# Patient Record
Sex: Female | Born: 1975 | Race: White | Hispanic: No | Marital: Married | Smoking: Current every day smoker
Health system: Southern US, Community
[De-identification: ages and names within clinical notes are randomized; demographics above are authoritative.]

## PROBLEM LIST (undated history)

## (undated) ENCOUNTER — Inpatient Hospital Stay (HOSPITAL_COMMUNITY): Payer: Self-pay

## (undated) DIAGNOSIS — Z98891 History of uterine scar from previous surgery: Secondary | ICD-10-CM

## (undated) DIAGNOSIS — D649 Anemia, unspecified: Secondary | ICD-10-CM

## (undated) DIAGNOSIS — E282 Polycystic ovarian syndrome: Secondary | ICD-10-CM

## (undated) DIAGNOSIS — K219 Gastro-esophageal reflux disease without esophagitis: Secondary | ICD-10-CM

## (undated) DIAGNOSIS — J45909 Unspecified asthma, uncomplicated: Secondary | ICD-10-CM

## (undated) DIAGNOSIS — K589 Irritable bowel syndrome without diarrhea: Secondary | ICD-10-CM

## (undated) DIAGNOSIS — F419 Anxiety disorder, unspecified: Secondary | ICD-10-CM

## (undated) DIAGNOSIS — F99 Mental disorder, not otherwise specified: Secondary | ICD-10-CM

## (undated) DIAGNOSIS — F32A Depression, unspecified: Secondary | ICD-10-CM

## (undated) DIAGNOSIS — F988 Other specified behavioral and emotional disorders with onset usually occurring in childhood and adolescence: Secondary | ICD-10-CM

## (undated) DIAGNOSIS — R87619 Unspecified abnormal cytological findings in specimens from cervix uteri: Secondary | ICD-10-CM

## (undated) DIAGNOSIS — E079 Disorder of thyroid, unspecified: Secondary | ICD-10-CM

## (undated) DIAGNOSIS — I10 Essential (primary) hypertension: Secondary | ICD-10-CM

## (undated) DIAGNOSIS — IMO0002 Reserved for concepts with insufficient information to code with codable children: Secondary | ICD-10-CM

## (undated) DIAGNOSIS — D219 Benign neoplasm of connective and other soft tissue, unspecified: Secondary | ICD-10-CM

## (undated) DIAGNOSIS — E785 Hyperlipidemia, unspecified: Secondary | ICD-10-CM

## (undated) DIAGNOSIS — O26879 Cervical shortening, unspecified trimester: Secondary | ICD-10-CM

## (undated) DIAGNOSIS — Z5189 Encounter for other specified aftercare: Secondary | ICD-10-CM

## (undated) DIAGNOSIS — M1990 Unspecified osteoarthritis, unspecified site: Secondary | ICD-10-CM

## (undated) DIAGNOSIS — O3430 Maternal care for cervical incompetence, unspecified trimester: Secondary | ICD-10-CM

## (undated) DIAGNOSIS — T7840XA Allergy, unspecified, initial encounter: Secondary | ICD-10-CM

## (undated) DIAGNOSIS — O24419 Gestational diabetes mellitus in pregnancy, unspecified control: Secondary | ICD-10-CM

## (undated) HISTORY — DX: Anemia, unspecified: D64.9

## (undated) HISTORY — PX: TONSILLECTOMY: SUR1361

## (undated) HISTORY — DX: Other specified behavioral and emotional disorders with onset usually occurring in childhood and adolescence: F98.8

## (undated) HISTORY — PX: LEEP: SHX91

## (undated) HISTORY — DX: Allergy, unspecified, initial encounter: T78.40XA

## (undated) HISTORY — DX: Gestational diabetes mellitus in pregnancy, unspecified control: O24.419

## (undated) HISTORY — DX: Polycystic ovarian syndrome: E28.2

## (undated) HISTORY — DX: Hyperlipidemia, unspecified: E78.5

## (undated) HISTORY — DX: History of uterine scar from previous surgery: Z98.891

## (undated) HISTORY — DX: Unspecified osteoarthritis, unspecified site: M19.90

## (undated) HISTORY — PX: WISDOM TOOTH EXTRACTION: SHX21

## (undated) HISTORY — PX: TUBAL LIGATION: SHX77

## (undated) HISTORY — DX: Disorder of thyroid, unspecified: E07.9

## (undated) HISTORY — DX: Encounter for other specified aftercare: Z51.89

## (undated) HISTORY — PX: THERAPEUTIC ABORTION: SHX798

## (undated) HISTORY — DX: Depression, unspecified: F32.A

## (undated) HISTORY — PX: BARTHOLIN CYST MARSUPIALIZATION: SHX5383

---

## 1998-01-17 ENCOUNTER — Other Ambulatory Visit: Admission: RE | Admit: 1998-01-17 | Discharge: 1998-01-17 | Payer: Self-pay | Admitting: Obstetrics and Gynecology

## 1998-04-12 ENCOUNTER — Ambulatory Visit (HOSPITAL_COMMUNITY): Admission: RE | Admit: 1998-04-12 | Discharge: 1998-04-12 | Payer: Self-pay | Admitting: Obstetrics and Gynecology

## 1998-04-19 ENCOUNTER — Inpatient Hospital Stay (HOSPITAL_COMMUNITY): Admission: AD | Admit: 1998-04-19 | Discharge: 1998-04-19 | Payer: Self-pay | Admitting: Obstetrics & Gynecology

## 1999-03-05 ENCOUNTER — Other Ambulatory Visit: Admission: RE | Admit: 1999-03-05 | Discharge: 1999-03-05 | Payer: Self-pay | Admitting: Obstetrics and Gynecology

## 2000-05-21 ENCOUNTER — Other Ambulatory Visit: Admission: RE | Admit: 2000-05-21 | Discharge: 2000-05-21 | Payer: Self-pay | Admitting: Obstetrics and Gynecology

## 2001-05-23 ENCOUNTER — Other Ambulatory Visit: Admission: RE | Admit: 2001-05-23 | Discharge: 2001-05-23 | Payer: Self-pay | Admitting: Obstetrics and Gynecology

## 2001-11-01 ENCOUNTER — Inpatient Hospital Stay (HOSPITAL_COMMUNITY): Admission: AD | Admit: 2001-11-01 | Discharge: 2001-11-04 | Payer: Self-pay | Admitting: Obstetrics and Gynecology

## 2002-01-17 ENCOUNTER — Other Ambulatory Visit: Admission: RE | Admit: 2002-01-17 | Discharge: 2002-01-17 | Payer: Self-pay | Admitting: Obstetrics and Gynecology

## 2003-01-17 ENCOUNTER — Other Ambulatory Visit: Admission: RE | Admit: 2003-01-17 | Discharge: 2003-01-17 | Payer: Self-pay | Admitting: Obstetrics and Gynecology

## 2004-06-30 ENCOUNTER — Emergency Department (HOSPITAL_COMMUNITY): Admission: EM | Admit: 2004-06-30 | Discharge: 2004-06-30 | Payer: Self-pay | Admitting: *Deleted

## 2005-03-02 ENCOUNTER — Emergency Department (HOSPITAL_COMMUNITY): Admission: EM | Admit: 2005-03-02 | Discharge: 2005-03-02 | Payer: Self-pay | Admitting: Emergency Medicine

## 2005-03-02 ENCOUNTER — Inpatient Hospital Stay (HOSPITAL_COMMUNITY): Admission: RE | Admit: 2005-03-02 | Discharge: 2005-03-07 | Payer: Self-pay | Admitting: Psychiatry

## 2005-03-03 ENCOUNTER — Ambulatory Visit: Payer: Self-pay | Admitting: Psychiatry

## 2010-11-28 ENCOUNTER — Other Ambulatory Visit: Payer: Self-pay | Admitting: Obstetrics and Gynecology

## 2010-11-29 ENCOUNTER — Other Ambulatory Visit: Payer: Self-pay | Admitting: Obstetrics and Gynecology

## 2010-12-10 ENCOUNTER — Other Ambulatory Visit (HOSPITAL_COMMUNITY): Payer: Self-pay | Admitting: Obstetrics and Gynecology

## 2010-12-10 DIAGNOSIS — O3680X0 Pregnancy with inconclusive fetal viability, not applicable or unspecified: Secondary | ICD-10-CM

## 2010-12-11 ENCOUNTER — Ambulatory Visit (HOSPITAL_COMMUNITY)
Admission: RE | Admit: 2010-12-11 | Discharge: 2010-12-11 | Disposition: A | Discharge status: Home / Self Care | Payer: BC Managed Care – PPO | Source: Ambulatory Visit | Attending: Obstetrics and Gynecology | Admitting: Obstetrics and Gynecology

## 2010-12-11 DIAGNOSIS — O209 Hemorrhage in early pregnancy, unspecified: Secondary | ICD-10-CM | POA: Insufficient documentation

## 2010-12-11 DIAGNOSIS — O3680X0 Pregnancy with inconclusive fetal viability, not applicable or unspecified: Secondary | ICD-10-CM

## 2010-12-11 DIAGNOSIS — Z3689 Encounter for other specified antenatal screening: Secondary | ICD-10-CM | POA: Insufficient documentation

## 2011-02-04 ENCOUNTER — Ambulatory Visit (HOSPITAL_COMMUNITY)
Admission: RE | Admit: 2011-02-04 | Discharge: 2011-02-04 | Disposition: A | Discharge status: Home / Self Care | Payer: BC Managed Care – PPO | Source: Ambulatory Visit | Attending: Internal Medicine | Admitting: Internal Medicine

## 2011-02-04 ENCOUNTER — Other Ambulatory Visit (HOSPITAL_COMMUNITY): Payer: Self-pay | Admitting: Internal Medicine

## 2011-02-04 DIAGNOSIS — I10 Essential (primary) hypertension: Secondary | ICD-10-CM | POA: Insufficient documentation

## 2011-02-04 DIAGNOSIS — R0989 Other specified symptoms and signs involving the circulatory and respiratory systems: Secondary | ICD-10-CM | POA: Insufficient documentation

## 2011-02-04 DIAGNOSIS — R059 Cough, unspecified: Secondary | ICD-10-CM

## 2011-02-04 DIAGNOSIS — Z0000 Encounter for general adult medical examination without abnormal findings: Secondary | ICD-10-CM

## 2011-02-04 DIAGNOSIS — R05 Cough: Secondary | ICD-10-CM | POA: Insufficient documentation

## 2011-02-04 DIAGNOSIS — F17200 Nicotine dependence, unspecified, uncomplicated: Secondary | ICD-10-CM | POA: Insufficient documentation

## 2012-09-29 DIAGNOSIS — S93409A Sprain of unspecified ligament of unspecified ankle, initial encounter: Secondary | ICD-10-CM | POA: Insufficient documentation

## 2013-02-14 LAB — OB RESULTS CONSOLE HIV ANTIBODY (ROUTINE TESTING): HIV: NONREACTIVE

## 2013-02-14 LAB — OB RESULTS CONSOLE RUBELLA ANTIBODY, IGM: Rubella: IMMUNE

## 2013-02-14 LAB — OB RESULTS CONSOLE ABO/RH: RH Type: POSITIVE

## 2013-02-14 LAB — OB RESULTS CONSOLE RPR: RPR: NONREACTIVE

## 2013-02-14 LAB — OB RESULTS CONSOLE HEPATITIS B SURFACE ANTIGEN: Hepatitis B Surface Ag: NEGATIVE

## 2013-02-14 LAB — OB RESULTS CONSOLE ANTIBODY SCREEN: Antibody Screen: NEGATIVE

## 2013-04-18 ENCOUNTER — Other Ambulatory Visit: Payer: Self-pay

## 2013-04-19 ENCOUNTER — Ambulatory Visit (HOSPITAL_COMMUNITY)
Admission: RE | Admit: 2013-04-19 | Discharge: 2013-04-19 | Disposition: A | Discharge status: Home / Self Care | Payer: PRIVATE HEALTH INSURANCE | Source: Ambulatory Visit | Attending: Obstetrics and Gynecology | Admitting: Obstetrics and Gynecology

## 2013-04-19 ENCOUNTER — Encounter (HOSPITAL_COMMUNITY): Payer: Self-pay

## 2013-04-19 ENCOUNTER — Other Ambulatory Visit (HOSPITAL_COMMUNITY): Payer: Self-pay | Admitting: Obstetrics and Gynecology

## 2013-04-19 DIAGNOSIS — O3430 Maternal care for cervical incompetence, unspecified trimester: Secondary | ICD-10-CM

## 2013-04-19 DIAGNOSIS — O3490 Maternal care for abnormality of pelvic organ, unspecified, unspecified trimester: Secondary | ICD-10-CM | POA: Insufficient documentation

## 2013-04-19 DIAGNOSIS — O3440 Maternal care for other abnormalities of cervix, unspecified trimester: Secondary | ICD-10-CM | POA: Insufficient documentation

## 2013-04-19 DIAGNOSIS — O3410 Maternal care for benign tumor of corpus uteri, unspecified trimester: Secondary | ICD-10-CM | POA: Insufficient documentation

## 2013-04-19 DIAGNOSIS — O09529 Supervision of elderly multigravida, unspecified trimester: Secondary | ICD-10-CM | POA: Insufficient documentation

## 2013-04-19 DIAGNOSIS — Z1389 Encounter for screening for other disorder: Secondary | ICD-10-CM | POA: Insufficient documentation

## 2013-04-19 DIAGNOSIS — O34219 Maternal care for unspecified type scar from previous cesarean delivery: Secondary | ICD-10-CM | POA: Insufficient documentation

## 2013-04-19 DIAGNOSIS — Z363 Encounter for antenatal screening for malformations: Secondary | ICD-10-CM | POA: Insufficient documentation

## 2013-04-19 DIAGNOSIS — O358XX0 Maternal care for other (suspected) fetal abnormality and damage, not applicable or unspecified: Secondary | ICD-10-CM | POA: Insufficient documentation

## 2013-04-19 DIAGNOSIS — O26879 Cervical shortening, unspecified trimester: Secondary | ICD-10-CM | POA: Insufficient documentation

## 2013-04-20 ENCOUNTER — Encounter (HOSPITAL_COMMUNITY): Payer: Self-pay | Admitting: Obstetrics and Gynecology

## 2013-04-20 DIAGNOSIS — O26879 Cervical shortening, unspecified trimester: Secondary | ICD-10-CM

## 2013-04-20 HISTORY — DX: Cervical shortening, unspecified trimester: O26.879

## 2013-04-20 NOTE — H&P (Signed)
Kathleen Weaver is a 37 y.o. female [id number] at [facility] with shortened cervix, confirmed by MFM scan as indication for cerclage.  Pt with h/o TAB x 2, h/o term LTCS x 2, and LEEP.  D/w pt at length options for management with myself and MFM physician. Pregnancy uncomplicated to this point.  Pt voices understanding to r/b/a, wishes to proceed.  Maternal Medical History:  Fetal activity: Perceived fetal activity is normal.    Prenatal complications: no prenatal complications Prenatal Complications - Diabetes: none.    OB History   Grav Para Term Preterm Abortions TAB SAB Ect Mult Living   6 2 2  3 2 1   2     G1 & 2 TAB G3 LTCS 38wk female, breech, myomectomy with delivery G4 LTCS 38wk, female, repeat - increased scar tissue G5 SAB G6 present  +abn pap - ASCUS can't r/o HGSIL, nl colpo - LEEP Nl pap 2013 No STDs  Past Medical History  Diagnosis Date  . Short cervix affecting pregnancy 04/20/2013  borderline HTN, question thyroid dz, IBS, HA, ADD, Anxiety, Bipolar, PPD, asthmatic bronchitis, environmental allergies  PSH: T&A, D&E x 2, LTCS x 2, LEEP, marsuoilization of Bartholin's gland  Family History: cervical CA, HTN, Afib, depression, Lupus, CAD, DM; Down's in 3rd cousin. Social History:  has no tobacco, alcohol, and drug history on file. h/o tob - quit before pregnancy, MJ prior to pregnancy.  Remarried, opthalmic tech at [employer]   Prenatal Transfer Tool  Maternal Diabetes: No Genetic Screening: Normal Maternal Ultrasounds/Referrals: Abnormal:  Findings:   Other:short cervix Fetal Ultrasounds or other Referrals:  Referred to Materal Fetal Medicine  Maternal Substance Abuse:  Yes:  Type: Marijuana before preg, occ Significant Maternal Medications:  Meds include: Other: PNV, Prilosec Significant Maternal Lab Results:  None Other Comments:  None  Review of Systems  Constitutional: Negative.   HENT: Negative.   Eyes: Negative.   Respiratory: Negative.   Cardiovascular:  Negative.   Gastrointestinal: Negative.   Genitourinary:       Pelvic pressure, cramping  Musculoskeletal: Negative.   Skin: Negative.   Neurological: Negative.   Psychiatric/Behavioral: Negative.       Last menstrual period 12/03/2012. Maternal Exam:  Abdomen: Surgical scars: low transverse.   Fundal height is appropriate for gestation.    Introitus: Normal vulva. Normal vagina.  Pelvis: questionable for delivery.      Physical Exam  Constitutional: She is oriented to person, place, and time. She appears well-developed and well-nourished.  HENT:  Head: Normocephalic and atraumatic.  Cardiovascular: Normal rate and regular rhythm.   Respiratory: Effort normal and breath sounds normal. No respiratory distress. She has no wheezes.  GI: Soft. Bowel sounds are normal. She exhibits no distension. There is no tenderness.  Musculoskeletal: Normal range of motion.  Neurological: She is alert and oriented to person, place, and time.  Skin: Skin is warm and dry.  Psychiatric: She has a normal mood and affect. Her behavior is normal.    Prenatal labs: ABO, Rh:  A+ Antibody:  negative Rubella:  immune RPR:   NR HBsAg:   neg HIV:   neg GBS:   unknown  Hgb 12.0/ Ur Cx neg/GC neg/ Chl neg/ Plt 310K/ First Tri Scr WNL/ AFP WNL/ TSH 3.470  [place] nl NT, 3x2 cm LUS fibroid EDC 09/08/12 shortened CL, ant fibroid, ant plac, limited anat MFM [place] vtx, nl AFI, fundal plac, funneling cervix, ant fibroid, female - nl limited anat - needs f/u  Assessment/Plan:  37yo [id number] at 20+ with short CL for McDonald cerclage Motrin 800 mg D/w pt r/b/a - wishes to proceed Will f/u CL and anat   Kathleen Weaver,Kathleen Weaver 04/20/2013, 8:59 PM

## 2013-04-21 ENCOUNTER — Encounter (HOSPITAL_COMMUNITY): Payer: Self-pay | Admitting: *Deleted

## 2013-04-21 ENCOUNTER — Ambulatory Visit (HOSPITAL_COMMUNITY): Payer: PRIVATE HEALTH INSURANCE | Admitting: Anesthesiology

## 2013-04-21 ENCOUNTER — Ambulatory Visit (HOSPITAL_COMMUNITY)
Admission: RE | Admit: 2013-04-21 | Discharge: 2013-04-21 | Disposition: A | Discharge status: Home / Self Care | Payer: PRIVATE HEALTH INSURANCE | Source: Ambulatory Visit | Attending: Obstetrics and Gynecology | Admitting: Obstetrics and Gynecology

## 2013-04-21 ENCOUNTER — Encounter (HOSPITAL_COMMUNITY): Payer: PRIVATE HEALTH INSURANCE | Admitting: Anesthesiology

## 2013-04-21 ENCOUNTER — Encounter (HOSPITAL_COMMUNITY)
Admission: RE | Disposition: A | Discharge status: Home / Self Care | Payer: Self-pay | Source: Ambulatory Visit | Attending: Obstetrics and Gynecology

## 2013-04-21 DIAGNOSIS — O26879 Cervical shortening, unspecified trimester: Secondary | ICD-10-CM | POA: Insufficient documentation

## 2013-04-21 DIAGNOSIS — O3432 Maternal care for cervical incompetence, second trimester: Secondary | ICD-10-CM

## 2013-04-21 DIAGNOSIS — O3430 Maternal care for cervical incompetence, unspecified trimester: Secondary | ICD-10-CM

## 2013-04-21 HISTORY — DX: Cervical shortening, unspecified trimester: O26.879

## 2013-04-21 HISTORY — DX: Unspecified asthma, uncomplicated: J45.909

## 2013-04-21 HISTORY — DX: Maternal care for cervical incompetence, unspecified trimester: O34.30

## 2013-04-21 HISTORY — PX: CERVICAL CERCLAGE: SHX1329

## 2013-04-21 HISTORY — DX: Mental disorder, not otherwise specified: F99

## 2013-04-21 LAB — CBC
Hemoglobin: 10.7 g/dL — ABNORMAL LOW (ref 12.0–15.0)
MCHC: 34.2 g/dL (ref 30.0–36.0)
RDW: 13.7 % (ref 11.5–15.5)
WBC: 9 10*3/uL (ref 4.0–10.5)

## 2013-04-21 SURGERY — CERCLAGE, CERVIX, VAGINAL APPROACH
Anesthesia: Spinal | Site: Vagina

## 2013-04-21 MED ORDER — ONDANSETRON HCL 4 MG/2ML IJ SOLN
INTRAMUSCULAR | Status: DC | PRN
  Administered 2013-04-21: 4 mg via INTRAVENOUS

## 2013-04-21 MED ORDER — KETOROLAC TROMETHAMINE 30 MG/ML IJ SOLN: 15.0000 mg | Freq: Once | INTRAMUSCULAR | Status: DC | PRN

## 2013-04-21 MED ORDER — LACTATED RINGERS IV SOLN
INTRAVENOUS | Status: DC
  Administered 2013-04-21: 1000 mL via INTRAVENOUS
  Administered 2013-04-21: 07:00:00 via INTRAVENOUS

## 2013-04-21 MED ORDER — PHENYLEPHRINE HCL 10 MG/ML IJ SOLN
INTRAMUSCULAR | Status: DC | PRN
  Administered 2013-04-21: 40 ug via INTRAVENOUS
  Administered 2013-04-21: 80 ug via INTRAVENOUS
  Administered 2013-04-21: 40 ug via INTRAVENOUS

## 2013-04-21 MED ORDER — FENTANYL CITRATE 0.05 MG/ML IJ SOLN
INTRAMUSCULAR | Status: AC
  Filled 2013-04-21: qty 2

## 2013-04-21 MED ORDER — FENTANYL CITRATE 0.05 MG/ML IJ SOLN: 25.0000 ug | INTRAMUSCULAR | Status: DC | PRN

## 2013-04-21 MED ORDER — HYDROCODONE-ACETAMINOPHEN 5-325 MG PO TABS: 1.0000 | ORAL_TABLET | Freq: Four times a day (QID) | ORAL | Status: DC | PRN

## 2013-04-21 MED ORDER — METOCLOPRAMIDE HCL 5 MG/ML IJ SOLN: 10.0000 mg | Freq: Once | INTRAMUSCULAR | Status: DC | PRN

## 2013-04-21 MED ORDER — PANTOPRAZOLE SODIUM 40 MG PO TBEC
40.0000 mg | DELAYED_RELEASE_TABLET | Freq: Once | ORAL | Status: AC
  Administered 2013-04-21: 40 mg via ORAL

## 2013-04-21 MED ORDER — FENTANYL CITRATE 0.05 MG/ML IJ SOLN
INTRAMUSCULAR | Status: DC | PRN
  Administered 2013-04-21 (×2): 50 ug via INTRAVENOUS

## 2013-04-21 MED ORDER — LACTATED RINGERS IV SOLN: INTRAVENOUS | Status: DC

## 2013-04-21 MED ORDER — MEPERIDINE HCL 25 MG/ML IJ SOLN: 6.2500 mg | INTRAMUSCULAR | Status: DC | PRN

## 2013-04-21 MED ORDER — BUPIVACAINE IN DEXTROSE 0.75-8.25 % IT SOLN
INTRATHECAL | Status: DC | PRN
  Administered 2013-04-21: 1.2 mL via INTRATHECAL

## 2013-04-21 MED ORDER — PANTOPRAZOLE SODIUM 40 MG PO TBEC
DELAYED_RELEASE_TABLET | ORAL | Status: AC
  Filled 2013-04-21: qty 1

## 2013-04-21 SURGICAL SUPPLY — 21 items
CANISTER SUCT 3000ML (MISCELLANEOUS) ×2 IMPLANT
CATH ROBINSON RED A/P 16FR (CATHETERS) IMPLANT
CLOTH BEACON ORANGE TIMEOUT ST (SAFETY) ×2 IMPLANT
COUNTER NEEDLE 1200 MAGNETIC (NEEDLE) IMPLANT
GLOVE BIO SURGEON STRL SZ 6.5 (GLOVE) ×2 IMPLANT
GLOVE BIO SURGEON STRL SZ7 (GLOVE) ×2 IMPLANT
GOWN STRL REIN XL XLG (GOWN DISPOSABLE) ×4 IMPLANT
NDL SPNL 22GX3.5 QUINCKE BK (NEEDLE) IMPLANT
NEEDLE MAYO .5 CIRCLE (NEEDLE) ×2 IMPLANT
NEEDLE SPNL 22GX3.5 QUINCKE BK (NEEDLE) IMPLANT
NS IRRIG 1000ML POUR BTL (IV SOLUTION) ×2 IMPLANT
PACK VAGINAL MINOR WOMEN LF (CUSTOM PROCEDURE TRAY) ×2 IMPLANT
PAD OB MATERNITY 4.3X12.25 (PERSONAL CARE ITEMS) ×2 IMPLANT
PAD PREP 24X48 CUFFED NSTRL (MISCELLANEOUS) ×2 IMPLANT
SUT PROLENE 1 CTX 30  8455H (SUTURE) ×2
SUT PROLENE 1 CTX 30 8455H (SUTURE) ×2 IMPLANT
SYR CONTROL 10ML LL (SYRINGE) IMPLANT
TOWEL OR 17X24 6PK STRL BLUE (TOWEL DISPOSABLE) ×4 IMPLANT
TUBING NON-CON 1/4 X 20 CONN (TUBING) ×2 IMPLANT
WATER STERILE IRR 1000ML POUR (IV SOLUTION) ×2 IMPLANT
YANKAUER SUCT BULB TIP NO VENT (SUCTIONS) ×2 IMPLANT

## 2013-04-21 NOTE — Interval H&P Note (Signed)
History and Physical Interval Note:  04/21/2013 7:00 AM  Kathleen Weaver  has presented today for surgery, with the diagnosis of Cervical shortening  The various methods of treatment have been discussed with the patient and family. After consideration of risks, benefits and other options for treatment, the patient has consented to  Procedure(s): CERCLAGE CERVICAL (N/A) as a surgical intervention .  The patient's history has been reviewed, patient examined, no change in status, stable for surgery.  I have reviewed the patient's chart and labs.  Questions were answered to the patient's satisfaction.     BOVARD,Raden Byington

## 2013-04-21 NOTE — Transfer of Care (Signed)
Immediate Anesthesia Transfer of Care Note  Patient: Kathleen Weaver  Procedure(s) Performed: Procedure(s): CERCLAGE CERVICAL (N/A)  Patient Location: PACU  Anesthesia Type:Spinal  Level of Consciousness: awake, alert  and oriented  Airway & Oxygen Therapy: Patient Spontanous Breathing  Post-op Assessment: Report given to PACU RN and Post -op Vital signs reviewed and stable  Post vital signs: Reviewed and stable  Complications: No apparent anesthesia complications

## 2013-04-21 NOTE — Preoperative (Signed)
Beta Blockers   Reason not to administer Beta Blockers:Not Applicable 

## 2013-04-21 NOTE — Anesthesia Procedure Notes (Signed)
Spinal  Patient location during procedure: OR Start time: 04/21/2013 7:24 AM End time: 04/21/2013 7:31 AM Staffing Anesthesiologist: Leilani Able Performed by: anesthesiologist  Preanesthetic Checklist Completed: patient identified, surgical consent, pre-op evaluation, timeout performed, IV checked, risks and benefits discussed and monitors and equipment checked Spinal Block Patient position: sitting Prep: DuraPrep Patient monitoring: heart rate, cardiac monitor, continuous pulse ox and blood pressure Approach: midline Location: L3-4 Injection technique: single-shot Needle Needle type: Sprotte  Needle gauge: 24 G Needle length: 9 cm Assessment Sensory level: T10 Additional Notes Needed 18G Tuohy X 1 to 7cm followed by Sprotte to 11cm .

## 2013-04-21 NOTE — Anesthesia Preprocedure Evaluation (Signed)
Anesthesia Evaluation  Patient identified by MRN, date of birth, ID band Patient awake    Reviewed: Allergy & Precautions, H&P , NPO status , Patient's Chart, lab work & pertinent test results, reviewed documented beta blocker date and time   History of Anesthesia Complications Negative for: history of anesthetic complications  Airway Mallampati: II TM Distance: >3 FB Neck ROM: full    Dental  (+) Teeth Intact   Pulmonary asthma (allergy-related) , former smoker,  breath sounds clear to auscultation        Cardiovascular negative cardio ROS  Rhythm:regular Rate:Normal     Neuro/Psych negative neurological ROS  negative psych ROS   GI/Hepatic Neg liver ROS, GERD-  Medicated,  Endo/Other  Morbid obesity  Renal/GU negative Renal ROS  negative genitourinary   Musculoskeletal   Abdominal   Peds  Hematology negative hematology ROS (+)   Anesthesia Other Findings   Reproductive/Obstetrics (+) Pregnancy (20 weeks, cervical shortening)                           Anesthesia Physical Anesthesia Plan  ASA: III  Anesthesia Plan: Spinal   Post-op Pain Management:    Induction:   Airway Management Planned:   Additional Equipment:   Intra-op Plan:   Post-operative Plan:   Informed Consent: I have reviewed the patients History and Physical, chart, labs and discussed the procedure including the risks, benefits and alternatives for the proposed anesthesia with the patient or authorized representative who has indicated his/her understanding and acceptance.     Plan Discussed with: Surgeon and CRNA  Anesthesia Plan Comments:         Anesthesia Quick Evaluation

## 2013-04-21 NOTE — Anesthesia Postprocedure Evaluation (Signed)
Anesthesia Post Note  Patient: Kathleen Weaver  Procedure(s) Performed: Procedure(s) (LRB): CERCLAGE CERVICAL (N/A)  Anesthesia type: Spinal  Patient location: PACU  Post pain: Pain level controlled  Post assessment: Post-op Vital signs reviewed  Last Vitals:  Filed Vitals:   04/21/13 0830  BP: 115/43  Pulse: 79  Temp:   Resp: 17    Post vital signs: Reviewed  Level of consciousness: awake  Complications: No apparent anesthesia complications

## 2013-04-21 NOTE — Brief Op Note (Signed)
04/21/2013  8:20 AM  PATIENT:  Kathleen Weaver  37 y.o. female  PRE-OPERATIVE DIAGNOSIS:  Cervical shortening  POST-OPERATIVE DIAGNOSIS:  cervical shortening  PROCEDURE:  Procedure(s): CERCLAGE CERVICAL (N/A)  McDonald knot at 12 o'clock  SURGEON:  Surgeon(s) and Role:    * Sherron Monday, MD - Primary  ANESTHESIA:   spinal  EBL:  Total I/O In: 900 [I.V.:900] Out: 160 [Urine:150; Blood:10]  BLOOD ADMINISTERED:none  DRAINS: none   LOCAL MEDICATIONS USED:  NONE  SPECIMEN:  No Specimen  DISPOSITION OF SPECIMEN:  N/A  COUNTS:  YES  TOURNIQUET:  * No tourniquets in log *  DICTATION: .Other Dictation: Dictation Number [id number]  PLAN OF CARE: Discharge to home after PACU  PATIENT DISPOSITION:  PACU - hemodynamically stable.   Delay start of Pharmacological VTE agent (>24hrs) due to surgical blood loss or risk of bleeding: not applicable

## 2013-04-22 NOTE — Op Note (Signed)
NAMEJANIYLAH, Kathleen Weaver NO.:  [id number]  MEDICAL RECORD NO.:  [id number]  LOCATION:  [facility]                          FACILITY:  [facility]  PHYSICIAN:  Sherron Monday, MD        DATE OF BIRTH:  March 31, 1976  DATE OF PROCEDURE:  04/21/2013 DATE OF DISCHARGE:  04/21/2013                              OPERATIVE REPORT   PREOPERATIVE DIAGNOSIS:  Cervical shortening.  POSTOPERATIVE DIAGNOSIS:  Cervical shortening.  PROCEDURE:  Cervical cerclage, McDonald cerclage with the knot at 12 o'clock.  SURGEON:  Sherron Monday, MD  ANESTHESIA:  Spinal.  ESTIMATED BLOOD LOSS:  10 mL.  URINE OUTPUT:  150 mL clear urine via I and O cath.  IV FLUIDS:  900 mL.  COMPLICATIONS:  None.  PATHOLOGY:  None.  PROCEDURE:  After informed consent was reviewed with the patient, risks, benefits and alternatives were discussed with her.  The cerclage was indicated as her cervix had noted to be shortened on ultrasound.  She was followed by MFM who noted funneling as well as shortening and recommended cerclage as a possible treatment.  She was taken to the OR, where spinal anesthesia was placed and found to be adequate.  She was then placed in supine position with a leftward tilt, placed in the Yellofin stirrups, prepped with Betadine externally and saline internally.  A single suture of Prolene was placed as a McDonald cerclage at the cervicovaginal junction as a pursestring without difficulty or complication. Knot was tied at 12 and the sutures were left long, so they will be able to be found at the time of removal.  The patient tolerated the procedure well.  Sponge, lap, and needle count was correct x2 per the operating staff.  FINDINGS:  Cervix noted to be approximately 1-2 cm in length.  At prior to surgery, after surgery cerclage was palpated area and the cervix was felt to be approximately 2 cm in length.     Sherron Monday, MD     JB/MEDQ  D:  04/21/2013  T:  04/22/2013  Job:  [id number]

## 2013-04-24 ENCOUNTER — Encounter (HOSPITAL_COMMUNITY): Payer: Self-pay | Admitting: Obstetrics and Gynecology

## 2013-04-28 ENCOUNTER — Other Ambulatory Visit (HOSPITAL_COMMUNITY): Payer: Self-pay | Admitting: Obstetrics and Gynecology

## 2013-04-28 DIAGNOSIS — O09529 Supervision of elderly multigravida, unspecified trimester: Secondary | ICD-10-CM

## 2013-05-01 ENCOUNTER — Encounter (HOSPITAL_COMMUNITY): Payer: Self-pay | Admitting: *Deleted

## 2013-05-01 ENCOUNTER — Inpatient Hospital Stay (HOSPITAL_COMMUNITY)
Admission: AD | Admit: 2013-05-01 | Discharge: 2013-05-01 | Disposition: A | Discharge status: Home / Self Care | Payer: PRIVATE HEALTH INSURANCE | Source: Ambulatory Visit | Attending: Obstetrics and Gynecology | Admitting: Obstetrics and Gynecology

## 2013-05-01 DIAGNOSIS — R109 Unspecified abdominal pain: Secondary | ICD-10-CM

## 2013-05-01 DIAGNOSIS — M545 Low back pain, unspecified: Secondary | ICD-10-CM | POA: Insufficient documentation

## 2013-05-01 DIAGNOSIS — O26899 Other specified pregnancy related conditions, unspecified trimester: Secondary | ICD-10-CM

## 2013-05-01 DIAGNOSIS — Z87891 Personal history of nicotine dependence: Secondary | ICD-10-CM | POA: Insufficient documentation

## 2013-05-01 DIAGNOSIS — O3430 Maternal care for cervical incompetence, unspecified trimester: Secondary | ICD-10-CM | POA: Insufficient documentation

## 2013-05-01 DIAGNOSIS — N949 Unspecified condition associated with female genital organs and menstrual cycle: Secondary | ICD-10-CM | POA: Insufficient documentation

## 2013-05-01 DIAGNOSIS — O26879 Cervical shortening, unspecified trimester: Secondary | ICD-10-CM | POA: Insufficient documentation

## 2013-05-01 DIAGNOSIS — O3432 Maternal care for cervical incompetence, second trimester: Secondary | ICD-10-CM

## 2013-05-01 HISTORY — DX: Essential (primary) hypertension: I10

## 2013-05-01 HISTORY — DX: Anxiety disorder, unspecified: F41.9

## 2013-05-01 HISTORY — DX: Benign neoplasm of connective and other soft tissue, unspecified: D21.9

## 2013-05-01 HISTORY — DX: Unspecified abnormal cytological findings in specimens from cervix uteri: R87.619

## 2013-05-01 HISTORY — DX: Reserved for concepts with insufficient information to code with codable children: IMO0002

## 2013-05-01 LAB — URINE MICROSCOPIC-ADD ON

## 2013-05-01 LAB — URINALYSIS, ROUTINE W REFLEX MICROSCOPIC
Glucose, UA: NEGATIVE mg/dL
Leukocytes, UA: NEGATIVE
Nitrite: NEGATIVE
Protein, ur: NEGATIVE mg/dL
Specific Gravity, Urine: >1.03 — ABNORMAL HIGH (ref 1.005–1.030)
Urobilinogen, UA: 0.2 mg/dL (ref 0.0–1.0)

## 2013-05-01 NOTE — MAU Provider Note (Signed)
History     CSN: [id number]  Arrival date and time: 05/01/13 1422   First Provider Initiated Contact with Patient 05/01/13 1600      No chief complaint on file.  HPI Kathleen Weaver is 37 y.o. [phone] [redacted]w[redacted]d weeks presenting with lower abdominal pressure that began yesterday--intermittent yesterday, none through the night, and then restarted at work--tightening in the lower abdomin, radiated to the lower back and felt bad-lightheaded with nausea.  Ate lunch without difficulty.  Had cerclage 04/21/13 -Dr. Irena Cords follow up 1 week later.  Cerclage for hx of 2 cervical bx and LEEP procedure.  Denies vaginal bleeding.  Not sexually active at this time.    Past Medical History  Diagnosis Date  . Short cervix affecting pregnancy 04/20/2013  . Asthma   . Cervical cerclage suture present 04/21/2013  . Hypertension     no meds with BP  . Fibroid   . Abnormal Pap smear     bx x2; LEEP  . Mental disorder     mild bipolar  . Anxiety     no meds    Past Surgical History  Procedure Laterality Date  . Cervical cerclage N/A 04/21/2013    Procedure: CERCLAGE CERVICAL;  Surgeon: Sherron Monday, MD;  Location: [facility];  Service: Gynecology;  Laterality: N/A;  . Cesarean section    . Tonsillectomy    . Bartholin cyst marsupialization    . Therapeutic abortion      Family History  Problem Relation Age of Onset  . Cancer Mother     2 cousins- w/breast  . Hypertension Mother   . Cancer Paternal Uncle   . Hypertension Maternal Grandmother   . Stroke Maternal Grandmother   . Diabetes Maternal Grandfather   . Heart disease Maternal Grandfather   . Hypertension Maternal Grandfather   . Diabetes Paternal Grandmother   . Heart disease Paternal Grandfather   . Hypertension Paternal Grandfather   . Hearing loss Neg Hx     History  Substance Use Topics  . Smoking status: Former Smoker -- 0.50 packs/day    Quit date: 11/19/2012  . Smokeless tobacco: Former Neurosurgeon     Comment: quit with  preg  . Alcohol Use: No    Allergies:  Allergies  Allergen Reactions  . Latex     rash    Prescriptions prior to admission  Medication Sig Dispense Refill  . acetaminophen (TYLENOL) 500 MG tablet Take 500 mg by mouth every 6 (six) hours as needed for mild pain, moderate pain or headache.      Marland Kitchen omeprazole (PRILOSEC) 20 MG capsule Take 20 mg by mouth every other day.      . Prenatal Vit-Fe Fumarate-FA (PRENATAL MULTIVITAMIN) TABS tablet Take 1 tablet by mouth at bedtime.         Review of Systems  Constitutional: Positive for malaise/fatigue.  Gastrointestinal: Positive for abdominal pain (lower pelvic pressure). Negative for nausea and vomiting.  Genitourinary: Negative for dysuria, urgency, frequency and hematuria.       Neg for vaginal bleeding or discharge   Physical Exam   Blood pressure 133/68, pulse 88, temperature 98.6 F (37 C), temperature source Oral, resp. rate 20, height 5' 7.5" (1.715 m), weight 294 lb (133.358 kg), last menstrual period 12/03/2012, SpO2 99.00%.  Physical Exam  Constitutional: She is oriented to person, place, and time. She appears well-developed and well-nourished. No distress.  HENT:  Head: Normocephalic.  Neck: Normal range of motion.  Cardiovascular: Normal rate.  Respiratory: Effort normal.  GI: Soft. She exhibits no distension and no mass. There is no tenderness. There is no rebound and no guarding.  Genitourinary: There is no rash, tenderness or lesion on the right labia. There is no rash, tenderness or lesion on the left labia. Uterus is enlarged. Uterus is not tender. Cervix exhibits no motion tenderness, no discharge and no friability. There is tenderness around the vagina. No erythema or bleeding around the vagina. Vaginal discharge (normal appearing white discharge) found.  The cerclage appears to be intact.  On gentle exam, the cervix appears to be snug.  Neg for bleeding  Neurological: She is alert and oriented to person, place, and  time.  Skin: Skin is warm and dry.  Psychiatric: She has a normal mood and affect. Her behavior is normal.   Results for orders placed during the hospital encounter of 05/01/13 (from the past 24 hour(s))  URINALYSIS, ROUTINE W REFLEX MICROSCOPIC     Status: Abnormal   Collection Time    05/01/13  2:40 PM      Result Value Range   Color, Urine YELLOW  YELLOW   APPearance CLEAR  CLEAR   Specific Gravity, Urine >1.030 (*) 1.005 - 1.030   pH 5.5  5.0 - 8.0   Glucose, UA NEGATIVE  NEGATIVE mg/dL   Hgb urine dipstick TRACE (*) NEGATIVE   Bilirubin Urine NEGATIVE  NEGATIVE   Ketones, ur NEGATIVE  NEGATIVE mg/dL   Protein, ur NEGATIVE  NEGATIVE mg/dL   Urobilinogen, UA 0.2  0.0 - 1.0 mg/dL   Nitrite NEGATIVE  NEGATIVE   Leukocytes, UA NEGATIVE  NEGATIVE  URINE MICROSCOPIC-ADD ON     Status: Abnormal   Collection Time    05/01/13  2:40 PM      Result Value Range   Squamous Epithelial / LPF RARE  RARE   WBC, UA 0-2  <3 WBC/hpf   RBC / HPF 3-6  <3 RBC/hpf   Bacteria, UA FEW (*) RARE   Crystals CA OXALATE CRYSTALS (*) NEGATIVE   Urine-Other MUCOUS PRESENT     MAU Course  Procedures   TOCO- rare irritability                        FHR by doppler 140s  MDM Reported MSE to Dr. Jackelyn Knife.  Order given for speculum/pelvic exam.    Assessment and Plan  A:  Lower pelvic pressure at [redacted]w[redacted]d gestation     Cerclage placed 04/21/13.  P:  Patient has appointment tomorrow in MFM      Has appointment with Dr. Ellyn Hack next week     Return for increased sxs, vaginal bleeding or leaking of fluid    Stressed importance of staying well hydrated.  KEY,EVE M 05/01/2013, 4:03 PM

## 2013-05-01 NOTE — MAU Note (Signed)
Been having a lot of tightening, stays for a while. Started yesterday morning. Has sharp pains in back,not always with abd pain. increased pelvic pressure.  Had a cerclage put  In 12/12.  No bleeding.

## 2013-05-02 ENCOUNTER — Ambulatory Visit (HOSPITAL_COMMUNITY)
Admission: RE | Admit: 2013-05-02 | Discharge: 2013-05-02 | Disposition: A | Discharge status: Home / Self Care | Payer: PRIVATE HEALTH INSURANCE | Source: Ambulatory Visit | Attending: Obstetrics and Gynecology | Admitting: Obstetrics and Gynecology

## 2013-05-02 VITALS — BP 133/68 | HR 86 | Wt 294.0 lb

## 2013-05-02 DIAGNOSIS — O358XX0 Maternal care for other (suspected) fetal abnormality and damage, not applicable or unspecified: Secondary | ICD-10-CM | POA: Insufficient documentation

## 2013-05-02 DIAGNOSIS — O26879 Cervical shortening, unspecified trimester: Secondary | ICD-10-CM | POA: Insufficient documentation

## 2013-05-02 DIAGNOSIS — O3410 Maternal care for benign tumor of corpus uteri, unspecified trimester: Secondary | ICD-10-CM | POA: Insufficient documentation

## 2013-05-02 DIAGNOSIS — O26872 Cervical shortening, second trimester: Secondary | ICD-10-CM

## 2013-05-02 DIAGNOSIS — O3432 Maternal care for cervical incompetence, second trimester: Secondary | ICD-10-CM

## 2013-05-02 DIAGNOSIS — O09529 Supervision of elderly multigravida, unspecified trimester: Secondary | ICD-10-CM | POA: Insufficient documentation

## 2013-05-02 DIAGNOSIS — Z1389 Encounter for screening for other disorder: Secondary | ICD-10-CM | POA: Insufficient documentation

## 2013-05-02 DIAGNOSIS — O34219 Maternal care for unspecified type scar from previous cesarean delivery: Secondary | ICD-10-CM | POA: Insufficient documentation

## 2013-05-02 DIAGNOSIS — O3440 Maternal care for other abnormalities of cervix, unspecified trimester: Secondary | ICD-10-CM | POA: Insufficient documentation

## 2013-05-02 DIAGNOSIS — Z363 Encounter for antenatal screening for malformations: Secondary | ICD-10-CM | POA: Insufficient documentation

## 2013-05-02 NOTE — Progress Notes (Signed)
Kathleen Weaver  was seen today for an ultrasound appointment.  See full report in [facility].  Impression: Single IUP at 21 4/7 weeks Suspected cervical insufficiency s/p cerclage Cervical length stable (1.6 cm) - cerclage appears intact Somewhat limited views of the fetal heart again obtained due to poor ultrasound penetration and fetal position Posterior uterine myoma as noted above Normal amnioitc fluid volume  Recommendations: Evaluate cerclage with digital exam, speculum exam or transvaginal scan about every 1-2 weeks after surgery.  Follow up ultrasound in 4 weeks to reevaluate the fetal heart.  Alpha Gula, MD

## 2013-05-31 ENCOUNTER — Ambulatory Visit (HOSPITAL_COMMUNITY): Payer: PRIVATE HEALTH INSURANCE

## 2013-06-01 ENCOUNTER — Other Ambulatory Visit (HOSPITAL_COMMUNITY): Payer: Self-pay | Admitting: Obstetrics and Gynecology

## 2013-06-01 ENCOUNTER — Ambulatory Visit (HOSPITAL_COMMUNITY)
Admission: RE | Admit: 2013-06-01 | Discharge: 2013-06-01 | Disposition: A | Discharge status: Home / Self Care | Payer: PRIVATE HEALTH INSURANCE | Source: Ambulatory Visit | Attending: Obstetrics and Gynecology | Admitting: Obstetrics and Gynecology

## 2013-06-01 ENCOUNTER — Other Ambulatory Visit (HOSPITAL_COMMUNITY): Payer: Self-pay | Admitting: Maternal and Fetal Medicine

## 2013-06-01 DIAGNOSIS — O26872 Cervical shortening, second trimester: Secondary | ICD-10-CM

## 2013-06-01 DIAGNOSIS — O26879 Cervical shortening, unspecified trimester: Secondary | ICD-10-CM | POA: Insufficient documentation

## 2013-06-01 DIAGNOSIS — O3440 Maternal care for other abnormalities of cervix, unspecified trimester: Secondary | ICD-10-CM | POA: Insufficient documentation

## 2013-06-01 DIAGNOSIS — O09529 Supervision of elderly multigravida, unspecified trimester: Secondary | ICD-10-CM

## 2013-06-01 DIAGNOSIS — O3410 Maternal care for benign tumor of corpus uteri, unspecified trimester: Secondary | ICD-10-CM | POA: Insufficient documentation

## 2013-06-01 DIAGNOSIS — O34219 Maternal care for unspecified type scar from previous cesarean delivery: Secondary | ICD-10-CM | POA: Insufficient documentation

## 2013-06-01 IMAGING — US US OB FOLLOW-UP
1 series · 12 of 28 positions shown · non-contrast
Comparison: none

[Series 1: us ob follow-up · 0.13mm/px · 12 of 47 slices shown]
[im 2/47]
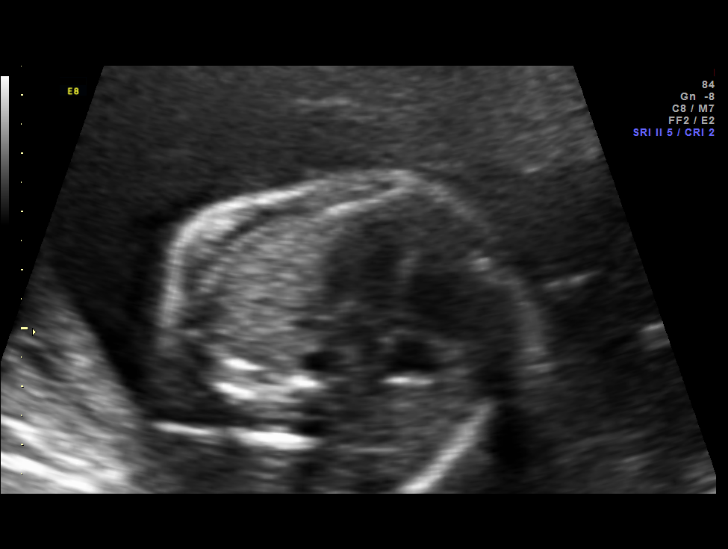
[im 6/47]
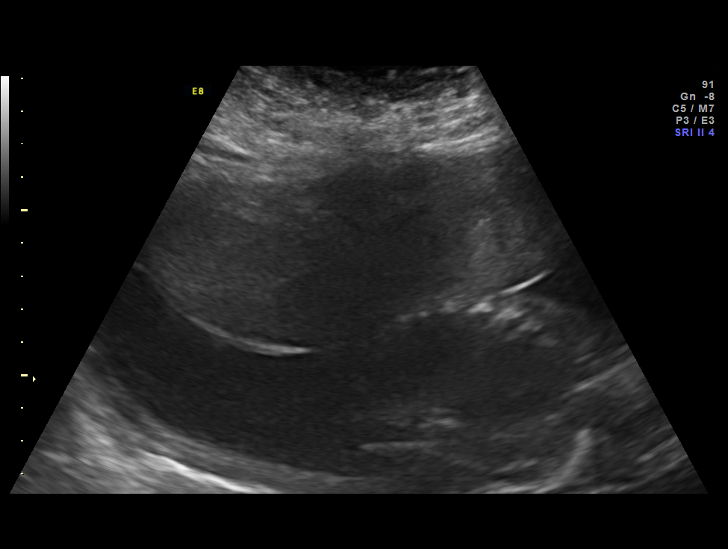
[im 9/47]
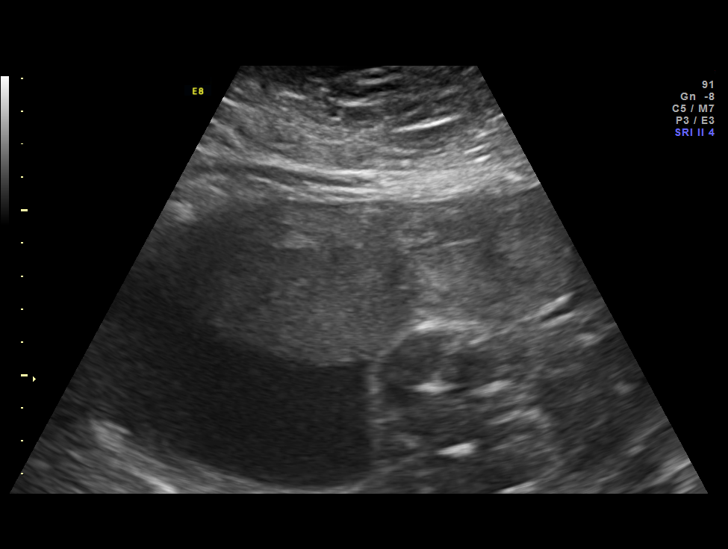
[im 14/47]
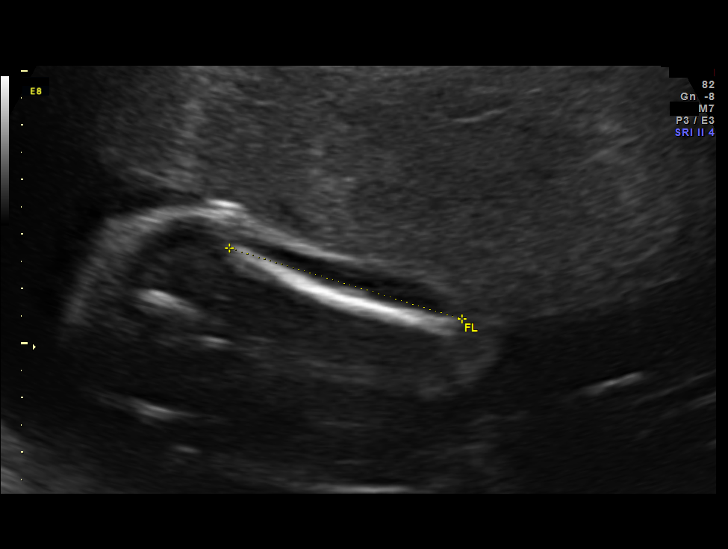
[im 18/47]
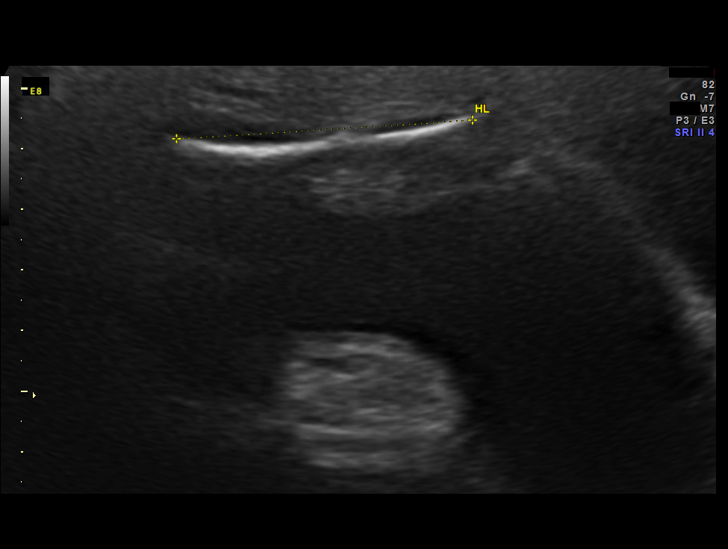
[im 21/47]
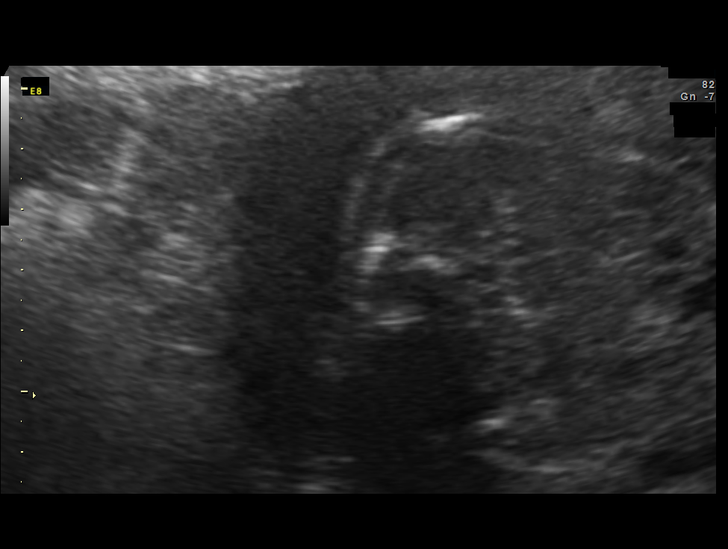
[im 26/47]
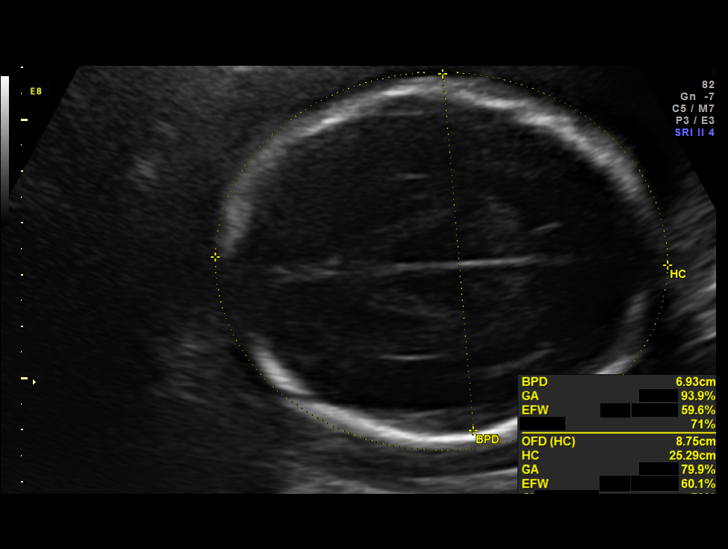
[im 29/47]
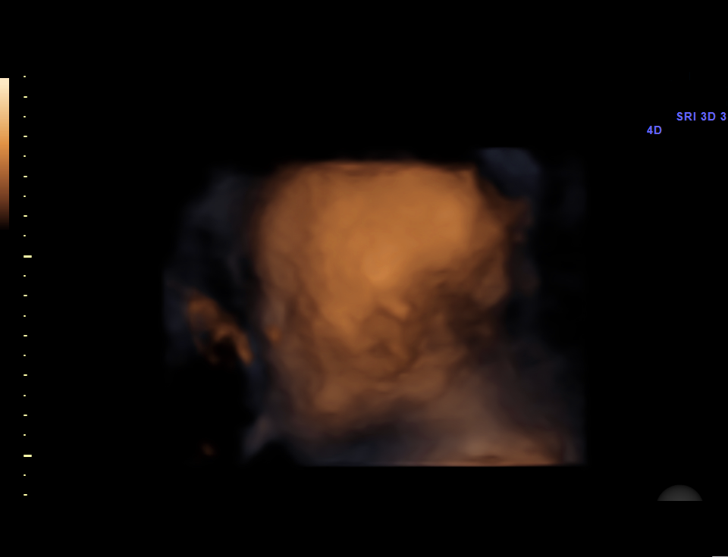
[im 33/47]
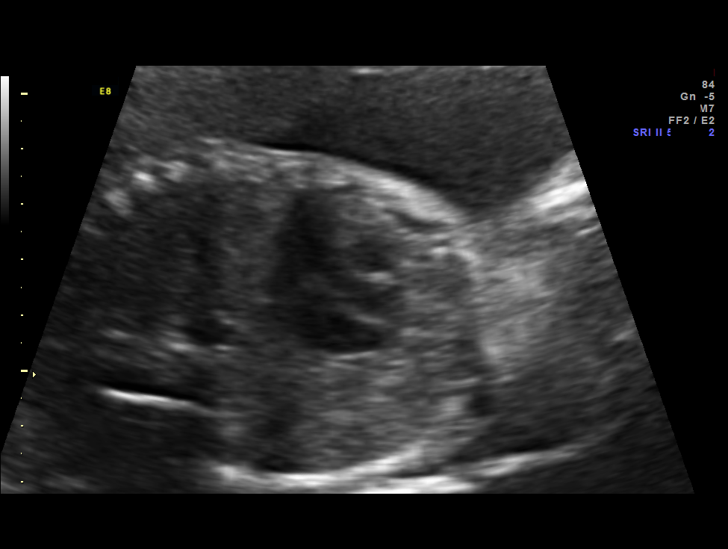
[im 38/47]
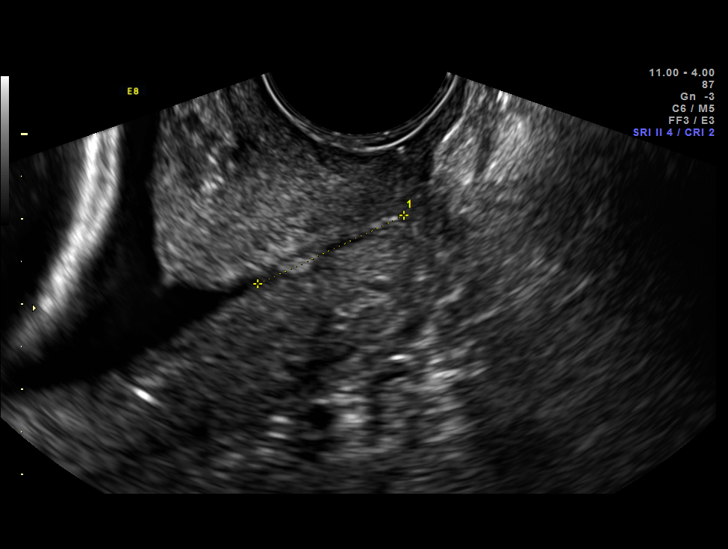
[im 41/47]
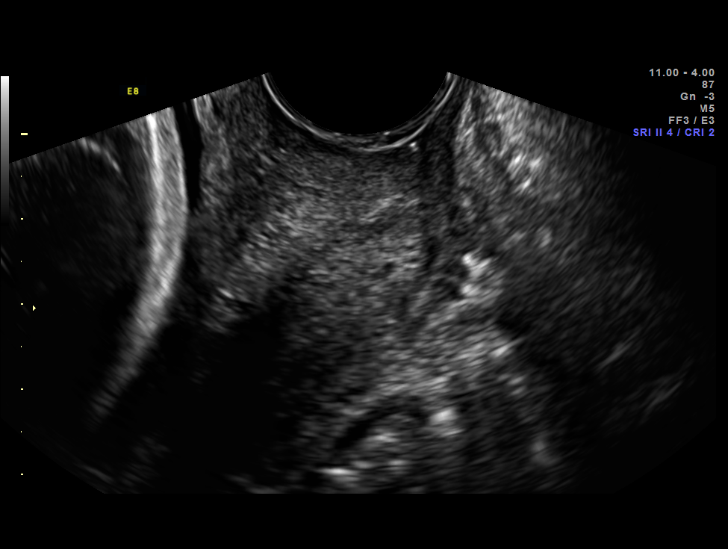
[im 45/47]
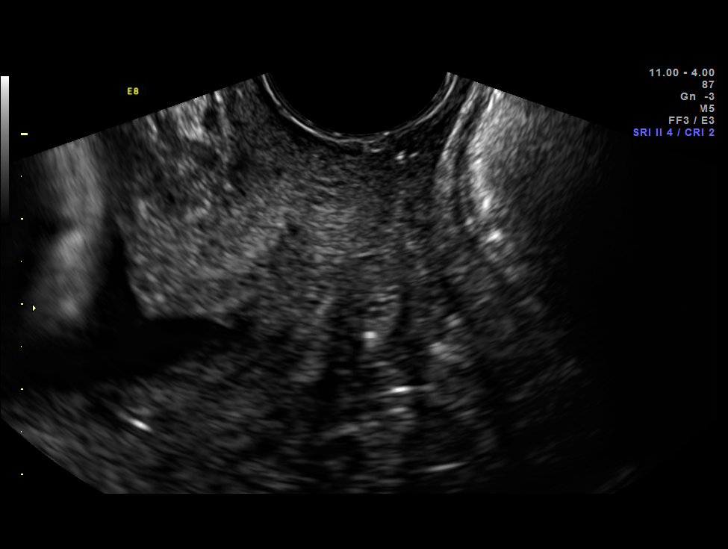

[12 of 28 positions shown; findings below may reference images not displayed]

OBSTETRICS REPORT
                      (Signed Final [DATE] [DATE])

Service(s) Provided

 US OB FOLLOW UP                                       76816.1
 US MFM OB TRANSVAGINAL                                76817.2
Indications

 Cervical shortening; S/P cerclage
 Advanced maternal age (AMA), Multigravida (low
 risk first trimester screen)
 History of cesarean delivery x2, currently pregnant   654.20, [JX]
 Previous cervical surgery (LEEP)
 Uterine scar, other than C/S (myomectomy)
 Uterine fibroid
Fetal Evaluation

 Num Of Fetuses:    1
 Fetal Heart Rate:  150                          bpm
 Cardiac Activity:  Observed
 Presentation:      Cephalic
 Placenta:          Fundal, above cervical os
 P. Cord            Previously Visualized
 Insertion:

 Amniotic Fluid
 AFI FV:      Subjectively within normal limits
 AFI Sum:     19.43   cm       78  %Tile     Larg Pckt:    5.16  cm
 RUQ:   4.77    cm   RLQ:    4.91   cm    LUQ:   5.16    cm   LLQ:    4.59   cm
Biometry

 BPD:     68.1  mm     G. Age:  27w 3d                CI:         78.3   70 - 86
 OFD:       87  mm                                    FL/HC:      19.2   18.6 -

 HC:     250.3  mm     G. Age:  27w 1d       71  %    HC/AC:      1.14   1.04 -

 AC:       219  mm     G. Age:  26w 3d       57  %    FL/BPD:     70.5   71 - 87
 FL:        48  mm     G. Age:  26w 1d       43  %    FL/AC:      21.9   20 - 24
 HUM:     48.9  mm     G. Age:  28w 6d     > 95  %

 Est. FW:     931  gm      2 lb 1 oz     64  %
Gestational Age

 LMP:           25w 6d        Date:  [DATE]                 EDD:   [DATE]
 U/S Today:     26w 5d                                        EDD:   [DATE]
 Best:          25w 6d     Det. By:  LMP  ([DATE])          EDD:   [DATE]
Anatomy

 Cranium:          Previously seen        Aortic Arch:      Not well visualized
 Fetal Cavum:      Previously seen        Ductal Arch:      Not well visualized
 Ventricles:       Previously seen        Diaphragm:        Appears normal
 Choroid Plexus:   Previously seen        Stomach:          Appears normal, left
                                                            sided
 Cerebellum:       Previously seen        Abdomen:          Previously seen
 Posterior Fossa:  Previously seen        Abdominal Wall:   Previously seen
 Nuchal Fold:      Not applicable (>20    Cord Vessels:     Previously seen
                   wks GA)
 Face:             Orbits and profile     Kidneys:          Appear normal
                   previously seen
 Lips:             Previously seen        Bladder:          Appears normal
 Heart:            Appears normal         Spine:            Previously seen
                   (4CH, axis, and
                   situs)
 RVOT:             Previously seen        Lower             Previously seen
                                          Extremities:
 LVOT:             Not well visualized    Upper             Previously seen
                                          Extremities:

 Other:  Female gender.
Cervix Uterus Adnexa

 Cervical Length:    1.9      cm

 Cervix:       Measured transvaginally; cerclage seen
Impression

 IUP at 25+6 weeks
 Normal interval anatomy; anatomic survey complete except
 for RVOT and arches
 Normal amniotic fluid volume
 Appropriate interval growth with EFW at the 64th %tile
Recommendations

 Continue to follow cervix clinically
 Follow-up as clinically indicated

## 2013-06-05 ENCOUNTER — Encounter: Payer: Self-pay | Admitting: Physician Assistant

## 2013-06-05 ENCOUNTER — Ambulatory Visit (INDEPENDENT_AMBULATORY_CARE_PROVIDER_SITE_OTHER): Payer: PRIVATE HEALTH INSURANCE | Admitting: Physician Assistant

## 2013-06-05 VITALS — BP 136/68 | HR 88 | Temp 98.1°F | Resp 16 | Ht 68.0 in | Wt 297.0 lb

## 2013-06-05 DIAGNOSIS — J209 Acute bronchitis, unspecified: Secondary | ICD-10-CM

## 2013-06-05 MED ORDER — PROMETHAZINE-DM 6.25-15 MG/5ML PO SYRP
5.0000 mL | ORAL_SOLUTION | Freq: Four times a day (QID) | ORAL | Status: DC | PRN
Start: 2013-06-05 — End: 2013-07-09

## 2013-06-05 MED ORDER — CEPHALEXIN 500 MG PO CAPS: 500.0000 mg | ORAL_CAPSULE | Freq: Three times a day (TID) | ORAL | Status: DC

## 2013-06-05 NOTE — Progress Notes (Signed)
   Subjective:    Patient ID: Kathleen Weaver, female    DOB: December 14, 1975, 38 y.o.   MRN: [id number]  Cough This is a new problem. Episode onset: 2-3 but cold for 2 weeks. The problem has been gradually worsening. The problem occurs constantly. The cough is productive of blood-tinged sputum. Associated symptoms include a fever, postnasal drip, rhinorrhea, shortness of breath and wheezing. Pertinent negatives include no chest pain, chills, ear congestion, ear pain, headaches, heartburn, hemoptysis, myalgias, nasal congestion, rash, sore throat, sweats or weight loss. Treatments tried: mucinex. The treatment provided no relief. Her past medical history is significant for asthma.   Current Outpatient Prescriptions on File Prior to Visit  Medication Sig Dispense Refill  . acetaminophen (TYLENOL) 500 MG tablet Take 500 mg by mouth every 6 (six) hours as needed for mild pain, moderate pain or headache.      Marland Kitchen omeprazole (PRILOSEC) 20 MG capsule Take 20 mg by mouth every other day.      . Prenatal Vit-Fe Fumarate-FA (PRENATAL MULTIVITAMIN) TABS tablet Take 1 tablet by mouth at bedtime.        No current facility-administered medications on file prior to visit.   Past Medical History  Diagnosis Date  . Short cervix affecting pregnancy 04/20/2013  . Asthma   . Cervical cerclage suture present 04/21/2013  . Hypertension     no meds with BP  . Fibroid   . Abnormal Pap smear     bx x2; LEEP  . Mental disorder     mild bipolar  . Anxiety     no meds    Review of Systems  Constitutional: Positive for fever. Negative for chills and weight loss.  HENT: Positive for postnasal drip and rhinorrhea. Negative for ear pain and sore throat.   Respiratory: Positive for cough, shortness of breath and wheezing. Negative for hemoptysis.   Cardiovascular: Negative for chest pain.  Gastrointestinal: Negative for heartburn.  Musculoskeletal: Negative for myalgias.  Skin: Negative for rash.  Neurological:  Negative for headaches.       Objective:   Physical Exam  Constitutional: She is oriented to person, place, and time. She appears well-developed and well-nourished.  HENT:  Head: Normocephalic and atraumatic.  Right Ear: External ear normal.  Left Ear: External ear normal.  Nose: Nose normal.  Mouth/Throat: Oropharynx is clear and moist.  Eyes: Conjunctivae are normal. Pupils are equal, round, and reactive to light.  Neck: Normal range of motion. Neck supple.  Cardiovascular: Normal rate and regular rhythm.   Pulmonary/Chest: Effort normal. No respiratory distress. She has wheezes. She has no rales. She exhibits no tenderness.  Abdominal: Soft. Bowel sounds are normal.  Lymphadenopathy:    She has no cervical adenopathy.  Neurological: She is alert and oriented to person, place, and time.  Skin: Skin is warm and dry.      Assessment & Plan:  Acute bronchitis - Plan: cephALEXin (KEFLEX) 500 MG capsule, promethazine-dextromethorphan (PROMETHAZINE-DM) 6.25-15 MG/5ML syrup + pregnant Patient will go to the ER if she has any wording SOB, CP.

## 2013-06-05 NOTE — Patient Instructions (Signed)

## 2013-06-21 ENCOUNTER — Inpatient Hospital Stay (HOSPITAL_COMMUNITY)
Admission: AD | Admit: 2013-06-21 | Discharge: 2013-06-21 | Disposition: A | Discharge status: Home / Self Care | Payer: PRIVATE HEALTH INSURANCE | Source: Ambulatory Visit | Attending: Obstetrics and Gynecology | Admitting: Obstetrics and Gynecology

## 2013-06-21 ENCOUNTER — Encounter (HOSPITAL_COMMUNITY): Payer: Self-pay | Admitting: *Deleted

## 2013-06-21 DIAGNOSIS — R0602 Shortness of breath: Secondary | ICD-10-CM

## 2013-06-21 DIAGNOSIS — J069 Acute upper respiratory infection, unspecified: Secondary | ICD-10-CM | POA: Insufficient documentation

## 2013-06-21 DIAGNOSIS — O9989 Other specified diseases and conditions complicating pregnancy, childbirth and the puerperium: Secondary | ICD-10-CM

## 2013-06-21 DIAGNOSIS — O99891 Other specified diseases and conditions complicating pregnancy: Secondary | ICD-10-CM | POA: Insufficient documentation

## 2013-06-21 DIAGNOSIS — R030 Elevated blood-pressure reading, without diagnosis of hypertension: Secondary | ICD-10-CM | POA: Insufficient documentation

## 2013-06-21 DIAGNOSIS — O26879 Cervical shortening, unspecified trimester: Secondary | ICD-10-CM | POA: Insufficient documentation

## 2013-06-21 DIAGNOSIS — G4733 Obstructive sleep apnea (adult) (pediatric): Secondary | ICD-10-CM | POA: Insufficient documentation

## 2013-06-21 LAB — URINALYSIS, ROUTINE W REFLEX MICROSCOPIC
Bilirubin Urine: NEGATIVE
Glucose, UA: NEGATIVE mg/dL
KETONES UR: NEGATIVE mg/dL
LEUKOCYTES UA: NEGATIVE
NITRITE: NEGATIVE
PH: 6 (ref 5.0–8.0)
PROTEIN: NEGATIVE mg/dL
Specific Gravity, Urine: >1.03 — ABNORMAL HIGH (ref 1.005–1.030)
UROBILINOGEN UA: 0.2 mg/dL (ref 0.0–1.0)

## 2013-06-21 LAB — URINE MICROSCOPIC-ADD ON

## 2013-06-21 MED ORDER — CETIRIZINE HCL 10 MG PO CAPS: 10.0000 mg | ORAL_CAPSULE | Freq: Every day | ORAL | Status: DC

## 2013-06-21 NOTE — MAU Provider Note (Signed)
None     Chief Complaint:  URI and Shortness of Breath   Kathleen Weaver is  38 y.o. [id number] at [redacted]w[redacted]d presents complaining of URI and Shortness of Breath .  She states none contractions are associated with no vaginal bleeding, intact membranes, along with active fetal movement.  Pt reports several weeks of SOB with exertion or prolonged talking. Pt reports recently being treated with keflex for an URI but stopped after 4 days without improvement and started to have vaginal itching that has since resolved. Pt states that symptoms are worse in warm environments when she starts to have PND associated with coughing. Pt states that she has a hx of allergic bronchitis that often requires levaquin, inhalers (albuterol, advair) and zyrtec. Pt has used albuterol and has noticed some improvement. Pt denies unilateral swelling but does have bilateral edema that is worse at end of work day.  Pt has elevated BP that is consistent with clinic BPs. Not severe range, asymptomatic. Not on medication but has been prior to pregnancy.  Pt is not having reflux symptoms, chest pain, N/v, f/c. No HA, vision changes, RUQ pain. Pt does report snoring, dry mouth, increased fatigue and microsleeps during the day.  +FM no lof, no vb, no ctx  Obstetrical/Gynecological History: OB History   Grav Para Term Preterm Abortions TAB SAB Ect Mult Living   6 2 2  3 2 1   2      Past Medical History: Past Medical History  Diagnosis Date  . Short cervix affecting pregnancy 04/20/2013  . Asthma   . Cervical cerclage suture present 04/21/2013  . Hypertension     no meds with BP  . Fibroid   . Abnormal Pap smear     bx x2; LEEP  . Mental disorder     mild bipolar  . Anxiety     no meds    Past Surgical History: Past Surgical History  Procedure Laterality Date  . Cervical cerclage N/A 04/21/2013    Procedure: CERCLAGE CERVICAL;  Surgeon: Thornell Sartorius, MD;  Location: [facility];  Service: Gynecology;  Laterality: N/A;   . Cesarean section    . Tonsillectomy    . Bartholin cyst marsupialization    . Therapeutic abortion      Family History: Family History  Problem Relation Age of Onset  . Cancer Mother     2 cousins- w/breast  . Hypertension Mother   . Cancer Paternal Uncle   . Hypertension Maternal Grandmother   . Stroke Maternal Grandmother   . Diabetes Maternal Grandfather   . Heart disease Maternal Grandfather   . Hypertension Maternal Grandfather   . Diabetes Paternal Grandmother   . Heart disease Paternal Grandfather   . Hypertension Paternal Grandfather   . Hearing loss Neg Hx     Social History: History  Substance Use Topics  . Smoking status: Former Smoker -- 0.50 packs/day    Quit date: 11/19/2012  . Smokeless tobacco: Former Systems developer     Comment: quit with preg  . Alcohol Use: No    Allergies:  Allergies  Allergen Reactions  . Latex     rash    Meds:  Prescriptions prior to admission  Medication Sig Dispense Refill  . acetaminophen (TYLENOL) 500 MG tablet Take 500 mg by mouth every 6 (six) hours as needed for mild pain, moderate pain or headache.      . cephALEXin (KEFLEX) 500 MG capsule Take 1 capsule (500 mg total) by mouth 3 (three)  times daily.  30 capsule  0  . omeprazole (PRILOSEC) 20 MG capsule Take 20 mg by mouth every other day.      . Prenatal Vit-Fe Fumarate-FA (PRENATAL MULTIVITAMIN) TABS tablet Take 1 tablet by mouth at bedtime.       . promethazine-dextromethorphan (PROMETHAZINE-DM) 6.25-15 MG/5ML syrup Take 5 mLs by mouth 4 (four) times daily as needed for cough.  240 mL  0    Review of Systems -   Review of Systems  As above   Physical Exam  Blood pressure 147/83, pulse 86, temperature 98.2 F (36.8 C), temperature source Oral, resp. rate 20, height 5\' 8"  (1.727 m), weight 137.621 kg (303 lb 6.4 oz), last menstrual period 12/03/2012, SpO2 99.00%. Filed Vitals:   06/21/13 1559 06/21/13 1619  BP: 150/66 147/83  Pulse: 84 86  Temp: 98.2 F (36.8  C)   TempSrc: Oral   Resp: 18 20  Height: 5\' 8"  (1.727 m)   Weight: 137.621 kg (303 lb 6.4 oz)   SpO2: 99%      GENERAL: Well-developed, well-nourished female in no acute distress.  LUNGS: Clear to auscultation bilaterally. No wheezes throughout lung fields HEART: Regular rate and rhythm. ABDOMEN: Soft, nontender, nondistended, gravid.  EXTREMITIES: Nontender, 2+ edema bil to mid shin, 2+ distal pulses. Rcalf 42cm, L calf 43cm  FHT:  Baseline rate 130s bpm   Variability moderate  Accelerations present   Decelerations none Contractions: none  Labs: No results found for this or any previous visit (from the past 24 hour(s)). Imaging Studies:  Not reviewed for this pregnancy  Assessment: Kathleen Weaver is  38 y.o. [id number] at [redacted]w[redacted]d presents with likely pregnancy related shortness of breath with possible contributing OSA and allergic symptoms. Strongly doubt PE with normal HR, O2sat, and L=R calf (<2cm).  - recommend restarting zyrtec (Rx given) - may continue albuterol PRN - Primary OB may consider evaluation for OSA and asthma  Lancelot Alyea RYAN 2/11/20154:29 PM

## 2013-06-21 NOTE — MAU Note (Signed)
Patient state she has had an upper respiratory infection for about 2 weeks. And been on antibiotics and well as OTC medication. States she does not feel like she is not getting better. Now beginning to have shortness of breath, worse with exertion and talking. Denies pregnancy problems. Reports good fetal movement.

## 2013-06-21 NOTE — Discharge Instructions (Signed)

## 2013-06-30 ENCOUNTER — Encounter: Payer: Self-pay | Admitting: *Deleted

## 2013-06-30 ENCOUNTER — Encounter: Payer: PRIVATE HEALTH INSURANCE | Attending: Obstetrics and Gynecology | Admitting: *Deleted

## 2013-06-30 VITALS — Ht 68.0 in | Wt 299.8 lb

## 2013-06-30 DIAGNOSIS — O99810 Abnormal glucose complicating pregnancy: Secondary | ICD-10-CM | POA: Insufficient documentation

## 2013-06-30 NOTE — Progress Notes (Signed)
  Patient was seen on 06/30/13 for Gestational Diabetes self-management class at the [facility]. The following learning objectives were met by the patient during this course:   States the definition of Gestational Diabetes  States why dietary management is important in controlling blood glucose  Describes the effects of carbohydrates on blood glucose levels  Demonstrates ability to create a balanced meal plan  Demonstrates carbohydrate counting   States when to check blood glucose levels  Demonstrates proper blood glucose monitoring techniques  States the effect of stress and exercise on blood glucose levels  States the importance of limiting caffeine and abstaining from alcohol and smoking  Plan:  Aim for 2 Carb Choices per meal (30 grams) +/- 1 either way for breakfast Aim for 3 Carb Choices per meal (45 grams) +/- 1 either way from lunch and dinner Aim for 1-2 Carbs per snack Begin reading food labels for Total Carbohydrate and sugar grams of foods Consider  increasing your activity level by walking daily as tolerated Begin checking BG before breakfast and 1-2 hours after first bit of breakfast, lunch and dinner after  as directed by MD  Take medication  as directed by MD  Blood glucose monitor given: Accu Chek Nano BG Monitoring Kit Lot # [id number] 06-10-14 Exp: 06-10-14 Blood glucose reading: 106  Patient instructed to monitor glucose levels: FBS: 60 - <90 1 hour: <140 2 hour: <120  Patient received the following handouts:  Nutrition Diabetes and Pregnancy  Carbohydrate Counting List  Meal Planning worksheet  Patient will be seen for follow-up as needed.

## 2013-07-09 ENCOUNTER — Inpatient Hospital Stay (HOSPITAL_COMMUNITY)
Admission: AD | Admit: 2013-07-09 | Discharge: 2013-07-09 | DRG: 781 | Disposition: A | Discharge status: Home / Self Care | Payer: PRIVATE HEALTH INSURANCE | Source: Ambulatory Visit | Attending: Obstetrics and Gynecology | Admitting: Obstetrics and Gynecology

## 2013-07-09 ENCOUNTER — Encounter (HOSPITAL_COMMUNITY): Payer: Self-pay | Admitting: Family

## 2013-07-09 DIAGNOSIS — Y92009 Unspecified place in unspecified non-institutional (private) residence as the place of occurrence of the external cause: Secondary | ICD-10-CM

## 2013-07-09 DIAGNOSIS — O24419 Gestational diabetes mellitus in pregnancy, unspecified control: Secondary | ICD-10-CM

## 2013-07-09 DIAGNOSIS — Z87891 Personal history of nicotine dependence: Secondary | ICD-10-CM

## 2013-07-09 DIAGNOSIS — O26879 Cervical shortening, unspecified trimester: Secondary | ICD-10-CM | POA: Diagnosis present

## 2013-07-09 DIAGNOSIS — O99891 Other specified diseases and conditions complicating pregnancy: Secondary | ICD-10-CM

## 2013-07-09 DIAGNOSIS — O99810 Abnormal glucose complicating pregnancy: Secondary | ICD-10-CM | POA: Diagnosis present

## 2013-07-09 DIAGNOSIS — O9989 Other specified diseases and conditions complicating pregnancy, childbirth and the puerperium: Principal | ICD-10-CM

## 2013-07-09 DIAGNOSIS — W1809XA Striking against other object with subsequent fall, initial encounter: Secondary | ICD-10-CM | POA: Diagnosis present

## 2013-07-09 DIAGNOSIS — R42 Dizziness and giddiness: Secondary | ICD-10-CM

## 2013-07-09 LAB — URINE MICROSCOPIC-ADD ON

## 2013-07-09 LAB — CBC
HCT: 29.8 % — ABNORMAL LOW (ref 36.0–46.0)
HEMOGLOBIN: 9.9 g/dL — AB (ref 12.0–15.0)
MCH: 30.1 pg (ref 26.0–34.0)
MCHC: 33.2 g/dL (ref 30.0–36.0)
MCV: 90.6 fL (ref 78.0–100.0)
Platelets: 267 10*3/uL (ref 150–400)
RBC: 3.29 MIL/uL — AB (ref 3.87–5.11)
RDW: 14.6 % (ref 11.5–15.5)
WBC: 7.2 10*3/uL (ref 4.0–10.5)

## 2013-07-09 LAB — URINALYSIS, ROUTINE W REFLEX MICROSCOPIC
Bilirubin Urine: NEGATIVE
GLUCOSE, UA: NEGATIVE mg/dL
Ketones, ur: 15 mg/dL — AB
Leukocytes, UA: NEGATIVE
Nitrite: NEGATIVE
PH: 6 (ref 5.0–8.0)
Protein, ur: NEGATIVE mg/dL
SPECIFIC GRAVITY, URINE: 1.025 (ref 1.005–1.030)
Urobilinogen, UA: 0.2 mg/dL (ref 0.0–1.0)

## 2013-07-09 LAB — COMPREHENSIVE METABOLIC PANEL
ALK PHOS: 82 U/L (ref 39–117)
ALT: 9 U/L (ref 0–35)
AST: 11 U/L (ref 0–37)
Albumin: 2.3 g/dL — ABNORMAL LOW (ref 3.5–5.2)
BUN: 6 mg/dL (ref 6–23)
CHLORIDE: 103 meq/L (ref 96–112)
CO2: 22 mEq/L (ref 19–32)
Calcium: 9 mg/dL (ref 8.4–10.5)
Creatinine, Ser: 0.61 mg/dL (ref 0.50–1.10)
GFR calc non Af Amer: >90 mL/min (ref >90)
GLUCOSE: 104 mg/dL — AB (ref 70–99)
POTASSIUM: 4.5 meq/L (ref 3.7–5.3)
Sodium: 136 mEq/L — ABNORMAL LOW (ref 137–147)
Total Bilirubin: 0.2 mg/dL — ABNORMAL LOW (ref 0.3–1.2)
Total Protein: 6.2 g/dL (ref 6.0–8.3)

## 2013-07-09 NOTE — Discharge Instructions (Signed)

## 2013-07-09 NOTE — MAU Provider Note (Signed)
Chief Complaint:  Dizziness   None     HPI: Kathleen Weaver is a 38 y.o. [id number] at 108w1dwho presents to maternity admissions reporting dizziness x2-3 episodes when standing starting at 3 am on 07/08/13.  At 3 am yesterday, she walked back from the bathroom to her bedroom and caught herself beginning to fall as she hit her head slightly on the edge of the doorway.  She did not fall to the ground.  She was diagnosed with GDM 1 week ago and started strict carbohydrate counting.  She reports the changes to her diet have been drastic from her diet before the diagnosis.  She also started Glyburide yesterday morning, a few hours after the first episode of dizziness. She reports she is eating sugar-free snack at 7 pm every evening, and no food after this until breakfast.  She reports good fetal movement, denies abdominal pain/contractions, LOF, vaginal bleeding, vaginal itching/burning, urinary symptoms, h/a, n/v, or fever/chills.     Past Medical History: Past Medical History  Diagnosis Date  . Short cervix affecting pregnancy 04/20/2013  . Asthma   . Cervical cerclage suture present 04/21/2013  . Hypertension     no meds with BP  . Fibroid   . Abnormal Pap smear     bx x2; LEEP  . Mental disorder     mild bipolar  . Anxiety     no meds  . Gestational diabetes mellitus, antepartum     Past obstetric history: OB History  Gravida Para Term Preterm AB SAB TAB Ectopic Multiple Living  6 2 2  3 1 2   2     # Outcome Date GA Lbr Len/2nd Weight Sex Delivery Anes PTL Lv  6 CUR           5 SAB 10/16/10          4 TRM 05/11/01    F LTCS   Y     Comments: scheduled repeat  3 TRM 05/11/96    M LTCS  N Y     Comments: breech  2 TAB 05/11/93          1 TAB 05/12/91              Past Surgical History: Past Surgical History  Procedure Laterality Date  . Cervical cerclage N/A 04/21/2013    Procedure: CERCLAGE CERVICAL;  Surgeon: Thornell Sartorius, MD;  Location: [facility];  Service: Gynecology;   Laterality: N/A;  . Cesarean section    . Tonsillectomy    . Bartholin cyst marsupialization    . Therapeutic abortion      Family History: Family History  Problem Relation Age of Onset  . Cancer Mother     2 cousins- w/breast  . Hypertension Mother   . Cancer Paternal Uncle   . Hypertension Maternal Grandmother   . Stroke Maternal Grandmother   . Diabetes Maternal Grandfather   . Heart disease Maternal Grandfather   . Hypertension Maternal Grandfather   . Diabetes Paternal Grandmother   . Heart disease Paternal Grandfather   . Hypertension Paternal Grandfather   . Hearing loss Neg Hx     Social History: History  Substance Use Topics  . Smoking status: Former Smoker -- 0.50 packs/day    Quit date: 11/19/2012  . Smokeless tobacco: Former Systems developer     Comment: quit with preg  . Alcohol Use: No    Allergies:  Allergies  Allergen Reactions  . Lisinopril Shortness Of Breath and Cough  .  Latex Rash    Meds:  Prescriptions prior to admission  Medication Sig Dispense Refill  . acetaminophen (TYLENOL) 500 MG tablet Take 500 mg by mouth every 6 (six) hours as needed for mild pain, moderate pain or headache.      . Cetirizine HCl (ZYRTEC PO) Take 1 tablet by mouth daily.      Marland Kitchen. glyBURIDE (DIABETA) 1.25 MG tablet Take 1.25 mg by mouth daily with breakfast.      . omeprazole (PRILOSEC) 20 MG capsule Take 20 mg by mouth every other day.      . Prenatal Vit-Fe Fumarate-FA (PRENATAL MULTIVITAMIN) TABS tablet Take 1 tablet by mouth at bedtime.       Marland Kitchen. albuterol (PROVENTIL HFA;VENTOLIN HFA) 108 (90 BASE) MCG/ACT inhaler Inhale 1 puff into the lungs every 6 (six) hours as needed for wheezing or shortness of breath.      . Cetirizine HCl (ZYRTEC ALLERGY) 10 MG CAPS Take 1 capsule (10 mg total) by mouth daily.  90 capsule  1    ROS: Pertinent findings in history of present illness.  Physical Exam  Blood pressure 131/61, pulse 79, temperature 97.7 F (36.5 C), temperature source Oral,  resp. rate 16, last menstrual period 12/03/2012. GENERAL: Well-developed, well-nourished female in no acute distress.  HEENT: normocephalic HEART: normal rate RESP: normal effort ABDOMEN: Soft, non-tender, gravid appropriate for gestational age EXTREMITIES: Nontender, no edema NEURO: alert, oriented, normal speech, no focal findings or movement disorder noted, cranial nerves II through XII intact, DTR's normal and symmetric, motor and sensory grossly normal bilaterally    FHT:  Baseline 145 , moderate variability, accelerations present, no decelerations Contractions: None on toco or to palpation   Labs: Results for orders placed during the hospital encounter of 07/09/13 (from the past 24 hour(s))  CBC     Status: Abnormal   Collection Time    07/09/13 12:24 AM      Result Value Ref Range   WBC 7.2  4.0 - 10.5 K/uL   RBC 3.29 (*) 3.87 - 5.11 MIL/uL   Hemoglobin 9.9 (*) 12.0 - 15.0 g/dL   HCT 16.129.8 (*) 09.636.0 - 04.546.0 %   MCV 90.6  78.0 - 100.0 fL   MCH 30.1  26.0 - 34.0 pg   MCHC 33.2  30.0 - 36.0 g/dL   RDW 40.914.6  81.111.5 - 91.415.5 %   Platelets 267  150 - 400 K/uL  COMPREHENSIVE METABOLIC PANEL     Status: Abnormal   Collection Time    07/09/13 12:24 AM      Result Value Ref Range   Sodium 136 (*) 137 - 147 mEq/L   Potassium 4.5  3.7 - 5.3 mEq/L   Chloride 103  96 - 112 mEq/L   CO2 22  19 - 32 mEq/L   Glucose, Bld 104 (*) 70 - 99 mg/dL   BUN 6  6 - 23 mg/dL   Creatinine, Ser 7.820.61  0.50 - 1.10 mg/dL   Calcium 9.0  8.4 - 95.610.5 mg/dL   Total Protein 6.2  6.0 - 8.3 g/dL   Albumin 2.3 (*) 3.5 - 5.2 g/dL   AST 11  0 - 37 U/L   ALT 9  0 - 35 U/L   Alkaline Phosphatase 82  39 - 117 U/L   Total Bilirubin 0.2 (*) 0.3 - 1.2 mg/dL   GFR calc non Af Amer >90  >90 mL/min   GFR calc Af Amer >90  >90 mL/min  URINALYSIS, ROUTINE  W REFLEX MICROSCOPIC     Status: Abnormal   Collection Time    07/09/13 10:05 AM      Result Value Ref Range   Color, Urine YELLOW  YELLOW   APPearance CLEAR  CLEAR    Specific Gravity, Urine 1.025  1.005 - 1.030   pH 6.0  5.0 - 8.0   Glucose, UA NEGATIVE  NEGATIVE mg/dL   Hgb urine dipstick TRACE (*) NEGATIVE   Bilirubin Urine NEGATIVE  NEGATIVE   Ketones, ur 15 (*) NEGATIVE mg/dL   Protein, ur NEGATIVE  NEGATIVE mg/dL   Urobilinogen, UA 0.2  0.0 - 1.0 mg/dL   Nitrite NEGATIVE  NEGATIVE   Leukocytes, UA NEGATIVE  NEGATIVE  URINE MICROSCOPIC-ADD ON     Status: Abnormal   Collection Time    07/09/13 10:05 AM      Result Value Ref Range   Squamous Epithelial / LPF FEW (*) RARE   WBC, UA 0-2  <3 WBC/hpf   RBC / HPF 0-2  <3 RBC/hpf   Bacteria, UA RARE  RARE   Urine-Other MUCOUS PRESENT       Assessment: 1. Dizziness   2. Gestational diabetes mellitus, class A2     Plan: Consult Dr Marvel Plan Discharge home Increase protein intake, increase calories by a small amount daily, and eat protein in a bedtime snack daily F/U in office this week Return to MAU as needed   Follow-up Information   Follow up with BOVARD,JODY, MD. (Make appointment in this week.  Return to MAU as needed.)    Specialty:  Obstetrics and Gynecology   Contact information:   [address] [place] [address] [phone]        Medication List         acetaminophen 500 MG tablet  Commonly known as:  TYLENOL  Take 500 mg by mouth every 6 (six) hours as needed for mild pain, moderate pain or headache.     albuterol 108 (90 BASE) MCG/ACT inhaler  Commonly known as:  PROVENTIL HFA;VENTOLIN HFA  Inhale 1 puff into the lungs every 6 (six) hours as needed for wheezing or shortness of breath.     glyBURIDE 1.25 MG tablet  Commonly known as:  DIABETA  Take 1.25 mg by mouth daily with breakfast.     omeprazole 20 MG capsule  Commonly known as:  PRILOSEC  Take 20 mg by mouth every other day.     prenatal multivitamin Tabs tablet  Take 1 tablet by mouth at bedtime.     Cetirizine HCl 10 MG Caps  Commonly known as:  ZYRTEC ALLERGY  Take 1 capsule  (10 mg total) by mouth daily.     ZYRTEC PO  Take 1 tablet by mouth daily.        Fatima Blank Certified Nurse-Midwife 07/09/2013 1:23 PM

## 2013-07-09 NOTE — MAU Note (Addendum)
38 yo, G6P2 at [redacted]w[redacted]d, presents to MAU with c/o dizziness noted at 0300 yesterday when she was up to BR. Reports she dangled over the bed for a short time, then got up. Noticed she was dizzy, then up off the toilet she noticed she was dizzy walking back to bed; hit head on doorjamb coming back into bed. Each time she was up to BR again twice before 0700, she was dizzy. Husband assisted her to shower and walked her down stairs. She ate and was on couch most of day.  This morning same symptoms; felt dizzy upon standing to go to BR; reports she was falling over, never descending to floor. She describes these episodes as a sensation of her head falling backwards.  Husband was always with her. She never lost consciousness. Denies tachycardia, SOB during episodes.  Of note, she began glyburide first dose at 0730 yesterday. Reports CBG of 103 at 0800 yesterday. CBG 83 at 0850 today.  Patient had cerclage in December d/t shortened cervix noted on [place].  Denies VB, LOF, contractions.

## 2013-08-28 ENCOUNTER — Encounter (HOSPITAL_COMMUNITY): Payer: Self-pay

## 2013-08-28 NOTE — Patient Instructions (Addendum)
   Your procedure is scheduled on:  Friday, April 24  Enter through the [employer] [facility] at:  8 AM Pick up the phone at the desk and dial [phone] and inform us of your arrival.  Please call this number if you have any problems the morning of surgery: [phone]  Remember: Do not eat or drink after midnight: Thursday Take these medicines the morning of surgery with a SIP OF WATER: None. Bring albuterol inhaler with you on day of surgery.  Patient instructed to 1/2 her night dose of glyburide Thursday night. Patient instructed to withhold Friday morning, day of surgery dose of glyburide.   Do not wear jewelry, make-up, or FINGER nail polish No metal in your hair or on your body. Do not wear lotions, powders, perfumes.  You may wear deodorant.  Do not bring valuables to the hospital. Contacts, dentures or bridgework may not be worn into surgery.  Leave suitcase in the car. After Surgery it may be brought to your room. For patients being admitted to the hospital, checkout time is 11:00am the day of discharge.  Home with husband Darryl cell [phone]

## 2013-08-29 ENCOUNTER — Encounter (HOSPITAL_COMMUNITY): Payer: Self-pay

## 2013-08-29 ENCOUNTER — Encounter (HOSPITAL_COMMUNITY)
Admission: RE | Admit: 2013-08-29 | Discharge: 2013-08-29 | Disposition: A | Discharge status: Home / Self Care | Payer: PRIVATE HEALTH INSURANCE | Source: Ambulatory Visit | Attending: Obstetrics and Gynecology | Admitting: Obstetrics and Gynecology

## 2013-08-29 VITALS — BP 144/76 | HR 82 | Resp 18 | Ht 68.0 in | Wt 294.0 lb

## 2013-08-29 DIAGNOSIS — O3430 Maternal care for cervical incompetence, unspecified trimester: Secondary | ICD-10-CM

## 2013-08-29 DIAGNOSIS — O26879 Cervical shortening, unspecified trimester: Secondary | ICD-10-CM

## 2013-08-29 HISTORY — DX: Gastro-esophageal reflux disease without esophagitis: K21.9

## 2013-08-29 LAB — RPR

## 2013-08-29 LAB — COMPREHENSIVE METABOLIC PANEL
ALBUMIN: 2.2 g/dL — AB (ref 3.5–5.2)
ALK PHOS: 128 U/L — AB (ref 39–117)
ALT: 20 U/L (ref 0–35)
AST: 15 U/L (ref 0–37)
BUN: 10 mg/dL (ref 6–23)
CHLORIDE: 102 meq/L (ref 96–112)
CO2: 23 mEq/L (ref 19–32)
Calcium: 8.9 mg/dL (ref 8.4–10.5)
Creatinine, Ser: 0.66 mg/dL (ref 0.50–1.10)
GFR calc Af Amer: >90 mL/min (ref >90)
GFR calc non Af Amer: >90 mL/min (ref >90)
GLUCOSE: 76 mg/dL (ref 70–99)
POTASSIUM: 4.7 meq/L (ref 3.7–5.3)
Sodium: 136 mEq/L — ABNORMAL LOW (ref 137–147)
Total Bilirubin: <0.2 mg/dL — ABNORMAL LOW (ref 0.3–1.2)
Total Protein: 6.2 g/dL (ref 6.0–8.3)

## 2013-08-29 LAB — CBC
HEMATOCRIT: 32.9 % — AB (ref 36.0–46.0)
HEMOGLOBIN: 11.1 g/dL — AB (ref 12.0–15.0)
MCH: 30.2 pg (ref 26.0–34.0)
MCHC: 33.7 g/dL (ref 30.0–36.0)
MCV: 89.6 fL (ref 78.0–100.0)
Platelets: 276 10*3/uL (ref 150–400)
RBC: 3.67 MIL/uL — ABNORMAL LOW (ref 3.87–5.11)
RDW: 14.4 % (ref 11.5–15.5)
WBC: 8 10*3/uL (ref 4.0–10.5)

## 2013-08-29 LAB — TYPE AND SCREEN
ABO/RH(D): A POS
Antibody Screen: NEGATIVE

## 2013-08-29 LAB — ABO/RH: ABO/RH(D): A POS

## 2013-08-30 ENCOUNTER — Inpatient Hospital Stay (HOSPITAL_COMMUNITY)
Admission: AD | Admit: 2013-08-30 | Discharge: 2013-08-31 | Disposition: A | Discharge status: Home / Self Care | Payer: PRIVATE HEALTH INSURANCE | Source: Ambulatory Visit | Attending: Obstetrics and Gynecology | Admitting: Obstetrics and Gynecology

## 2013-08-30 ENCOUNTER — Encounter (HOSPITAL_COMMUNITY): Payer: Self-pay | Admitting: *Deleted

## 2013-08-30 DIAGNOSIS — O4700 False labor before 37 completed weeks of gestation, unspecified trimester: Secondary | ICD-10-CM | POA: Insufficient documentation

## 2013-08-30 DIAGNOSIS — O34219 Maternal care for unspecified type scar from previous cesarean delivery: Secondary | ICD-10-CM | POA: Insufficient documentation

## 2013-08-30 DIAGNOSIS — O3430 Maternal care for cervical incompetence, unspecified trimester: Secondary | ICD-10-CM | POA: Insufficient documentation

## 2013-08-30 NOTE — MAU Note (Addendum)
Patient presents to MAu with c/o contractions that started yesterday but has gotten progressively worse in the last 2 hours; states contractions are 6-8 minutes apart. Denies LOF or VB at this time. Reports good fetal movement. Scheduled for repeat C/S 09/01/13. Had a cerclage removed at 37wks

## 2013-08-31 ENCOUNTER — Inpatient Hospital Stay (HOSPITAL_COMMUNITY)
Admission: AD | Admit: 2013-08-31 | Discharge: 2013-09-02 | DRG: 765 | Disposition: A | Discharge status: Home / Self Care | Payer: PRIVATE HEALTH INSURANCE | Source: Ambulatory Visit | Attending: Obstetrics and Gynecology | Admitting: Obstetrics and Gynecology

## 2013-08-31 ENCOUNTER — Inpatient Hospital Stay (HOSPITAL_COMMUNITY)
Admission: RE | Admit: 2013-08-31 | Payer: PRIVATE HEALTH INSURANCE | Source: Ambulatory Visit | Admitting: Obstetrics and Gynecology

## 2013-08-31 ENCOUNTER — Encounter (HOSPITAL_COMMUNITY): Payer: PRIVATE HEALTH INSURANCE | Admitting: Anesthesiology

## 2013-08-31 ENCOUNTER — Inpatient Hospital Stay (HOSPITAL_COMMUNITY): Payer: PRIVATE HEALTH INSURANCE | Admitting: Anesthesiology

## 2013-08-31 ENCOUNTER — Encounter (HOSPITAL_COMMUNITY)
Admission: AD | Disposition: A | Discharge status: Home / Self Care | Payer: Self-pay | Source: Ambulatory Visit | Attending: Obstetrics and Gynecology

## 2013-08-31 ENCOUNTER — Encounter (HOSPITAL_COMMUNITY): Payer: Self-pay | Admitting: *Deleted

## 2013-08-31 DIAGNOSIS — O99814 Abnormal glucose complicating childbirth: Principal | ICD-10-CM | POA: Diagnosis present

## 2013-08-31 DIAGNOSIS — Z6841 Body Mass Index (BMI) 40.0 and over, adult: Secondary | ICD-10-CM

## 2013-08-31 DIAGNOSIS — O1002 Pre-existing essential hypertension complicating childbirth: Secondary | ICD-10-CM | POA: Diagnosis present

## 2013-08-31 DIAGNOSIS — O26879 Cervical shortening, unspecified trimester: Secondary | ICD-10-CM

## 2013-08-31 DIAGNOSIS — Z302 Encounter for sterilization: Secondary | ICD-10-CM

## 2013-08-31 DIAGNOSIS — O99214 Obesity complicating childbirth: Secondary | ICD-10-CM

## 2013-08-31 DIAGNOSIS — Z98891 History of uterine scar from previous surgery: Secondary | ICD-10-CM

## 2013-08-31 DIAGNOSIS — O4290 Premature rupture of membranes, unspecified as to length of time between rupture and onset of labor, unspecified weeks of gestation: Secondary | ICD-10-CM | POA: Diagnosis present

## 2013-08-31 DIAGNOSIS — E669 Obesity, unspecified: Secondary | ICD-10-CM | POA: Diagnosis present

## 2013-08-31 DIAGNOSIS — O3430 Maternal care for cervical incompetence, unspecified trimester: Secondary | ICD-10-CM

## 2013-08-31 HISTORY — DX: History of uterine scar from previous surgery: Z98.891

## 2013-08-31 LAB — GLUCOSE, CAPILLARY
GLUCOSE-CAPILLARY: 92 mg/dL (ref 70–99)
GLUCOSE-CAPILLARY: 97 mg/dL (ref 70–99)
Glucose-Capillary: 111 mg/dL — ABNORMAL HIGH (ref 70–99)
Glucose-Capillary: 85 mg/dL (ref 70–99)
Glucose-Capillary: 156 mg/dL — ABNORMAL HIGH (ref 70–99)

## 2013-08-31 LAB — CBC
HCT: 33.1 % — ABNORMAL LOW (ref 36.0–46.0)
Hemoglobin: 11.2 g/dL — ABNORMAL LOW (ref 12.0–15.0)
MCH: 30.3 pg (ref 26.0–34.0)
MCHC: 33.8 g/dL (ref 30.0–36.0)
MCV: 89.5 fL (ref 78.0–100.0)
Platelets: 284 10*3/uL (ref 150–400)
RBC: 3.7 MIL/uL — ABNORMAL LOW (ref 3.87–5.11)
RDW: 14.4 % (ref 11.5–15.5)
WBC: 10 10*3/uL (ref 4.0–10.5)

## 2013-08-31 LAB — RPR

## 2013-08-31 SURGERY — Surgical Case
Anesthesia: Regional

## 2013-08-31 SURGERY — Surgical Case
Anesthesia: Spinal | Site: Abdomen

## 2013-08-31 MED ORDER — MORPHINE SULFATE (PF) 0.5 MG/ML IJ SOLN
INTRAMUSCULAR | Status: DC | PRN
  Administered 2013-08-31: .15 mg via EPIDURAL

## 2013-08-31 MED ORDER — PHENYLEPHRINE HCL 10 MG/ML IJ SOLN
INTRAMUSCULAR | Status: DC | PRN
  Administered 2013-08-31 (×3): 80 ug via INTRAVENOUS
  Administered 2013-08-31: 120 ug via INTRAVENOUS

## 2013-08-31 MED ORDER — PHENYLEPHRINE 40 MCG/ML (10ML) SYRINGE FOR IV PUSH (FOR BLOOD PRESSURE SUPPORT)
PREFILLED_SYRINGE | INTRAVENOUS | Status: AC
  Filled 2013-08-31: qty 5

## 2013-08-31 MED ORDER — ZOLPIDEM TARTRATE 5 MG PO TABS: 5.0000 mg | ORAL_TABLET | Freq: Every evening | ORAL | Status: DC | PRN

## 2013-08-31 MED ORDER — OXYTOCIN 40 UNITS IN LACTATED RINGERS INFUSION - SIMPLE MED: 62.5000 mL/h | INTRAVENOUS | Status: AC

## 2013-08-31 MED ORDER — SCOPOLAMINE 1 MG/3DAYS TD PT72
MEDICATED_PATCH | TRANSDERMAL | Status: AC
  Filled 2013-08-31: qty 1

## 2013-08-31 MED ORDER — ONDANSETRON HCL 4 MG/2ML IJ SOLN: 4.0000 mg | Freq: Three times a day (TID) | INTRAMUSCULAR | Status: DC | PRN

## 2013-08-31 MED ORDER — HYDROMORPHONE HCL PF 1 MG/ML IJ SOLN: 0.2500 mg | INTRAMUSCULAR | Status: DC | PRN

## 2013-08-31 MED ORDER — DIPHENHYDRAMINE HCL 50 MG/ML IJ SOLN
12.5000 mg | INTRAMUSCULAR | Status: DC | PRN
  Filled 2013-08-31: qty 1

## 2013-08-31 MED ORDER — SIMETHICONE 80 MG PO CHEW: 80.0000 mg | CHEWABLE_TABLET | ORAL | Status: DC | PRN

## 2013-08-31 MED ORDER — SODIUM CHLORIDE 0.9 % IJ SOLN: 3.0000 mL | INTRAMUSCULAR | Status: DC | PRN

## 2013-08-31 MED ORDER — SCOPOLAMINE 1 MG/3DAYS TD PT72
1.0000 | MEDICATED_PATCH | Freq: Once | TRANSDERMAL | Status: DC
  Administered 2013-08-31: 1.5 mg via TRANSDERMAL

## 2013-08-31 MED ORDER — PRENATAL MULTIVITAMIN CH
1.0000 | ORAL_TABLET | Freq: Every day | ORAL | Status: DC
  Administered 2013-09-01: 1 via ORAL
  Filled 2013-08-31: qty 1

## 2013-08-31 MED ORDER — WITCH HAZEL-GLYCERIN EX PADS: 1.0000 "application " | MEDICATED_PAD | CUTANEOUS | Status: DC | PRN

## 2013-08-31 MED ORDER — DIPHENHYDRAMINE HCL 25 MG PO CAPS: 25.0000 mg | ORAL_CAPSULE | Freq: Four times a day (QID) | ORAL | Status: DC | PRN

## 2013-08-31 MED ORDER — LACTATED RINGERS IV SOLN
INTRAVENOUS | Status: DC
  Administered 2013-08-31: 18:00:00 via INTRAVENOUS

## 2013-08-31 MED ORDER — OXYTOCIN 10 UNIT/ML IJ SOLN
40.0000 [IU] | INTRAVENOUS | Status: DC | PRN
  Administered 2013-08-31: 40 [IU] via INTRAVENOUS

## 2013-08-31 MED ORDER — ONDANSETRON HCL 4 MG/2ML IJ SOLN
INTRAMUSCULAR | Status: DC | PRN
  Administered 2013-08-31: 4 mg via INTRAVENOUS

## 2013-08-31 MED ORDER — PHENYLEPHRINE 8 MG IN D5W 100 ML (0.08MG/ML) PREMIX OPTIME
INJECTION | INTRAVENOUS | Status: DC | PRN
  Administered 2013-08-31: 60 ug/min via INTRAVENOUS

## 2013-08-31 MED ORDER — SENNOSIDES-DOCUSATE SODIUM 8.6-50 MG PO TABS
2.0000 | ORAL_TABLET | ORAL | Status: DC
  Administered 2013-08-31 – 2013-09-01 (×2): 2 via ORAL
  Filled 2013-08-31 (×2): qty 2

## 2013-08-31 MED ORDER — CEFAZOLIN SODIUM-DEXTROSE 2-3 GM-% IV SOLR
2.0000 g | Freq: Three times a day (TID) | INTRAVENOUS | Status: AC
  Administered 2013-08-31 – 2013-09-01 (×3): 2 g via INTRAVENOUS
  Filled 2013-08-31 (×3): qty 50

## 2013-08-31 MED ORDER — OXYCODONE-ACETAMINOPHEN 5-325 MG PO TABS
1.0000 | ORAL_TABLET | ORAL | Status: DC | PRN
  Administered 2013-08-31 – 2013-09-02 (×8): 2 via ORAL
  Filled 2013-08-31 (×8): qty 2

## 2013-08-31 MED ORDER — IBUPROFEN 600 MG PO TABS
600.0000 mg | ORAL_TABLET | Freq: Four times a day (QID) | ORAL | Status: DC
  Administered 2013-08-31 – 2013-09-02 (×6): 600 mg via ORAL
  Filled 2013-08-31 (×6): qty 1

## 2013-08-31 MED ORDER — NALOXONE HCL 1 MG/ML IJ SOLN: 1.0000 ug/kg/h | INTRAVENOUS | Status: DC | PRN

## 2013-08-31 MED ORDER — KETOROLAC TROMETHAMINE 30 MG/ML IJ SOLN
30.0000 mg | Freq: Four times a day (QID) | INTRAMUSCULAR | Status: AC | PRN
Start: 2013-08-31 — End: 2013-09-01
  Administered 2013-08-31: 30 mg via INTRAVENOUS
  Filled 2013-08-31: qty 1

## 2013-08-31 MED ORDER — ONDANSETRON HCL 4 MG/2ML IJ SOLN
INTRAMUSCULAR | Status: AC
  Filled 2013-08-31: qty 2

## 2013-08-31 MED ORDER — KETOROLAC TROMETHAMINE 30 MG/ML IJ SOLN
30.0000 mg | Freq: Four times a day (QID) | INTRAMUSCULAR | Status: AC | PRN
Start: 2013-08-31 — End: 2013-09-01
  Administered 2013-08-31: 30 mg via INTRAMUSCULAR

## 2013-08-31 MED ORDER — PANTOPRAZOLE SODIUM 40 MG PO TBEC
40.0000 mg | DELAYED_RELEASE_TABLET | Freq: Every day | ORAL | Status: AC
  Administered 2013-08-31 – 2013-09-02 (×3): 40 mg via ORAL
  Filled 2013-08-31 (×3): qty 1

## 2013-08-31 MED ORDER — MEPERIDINE HCL 25 MG/ML IJ SOLN: 6.2500 mg | INTRAMUSCULAR | Status: DC | PRN

## 2013-08-31 MED ORDER — TETANUS-DIPHTH-ACELL PERTUSSIS 5-2.5-18.5 LF-MCG/0.5 IM SUSP: 0.5000 mL | Freq: Once | INTRAMUSCULAR | Status: DC

## 2013-08-31 MED ORDER — LACTATED RINGERS IV SOLN
INTRAVENOUS | Status: DC
  Administered 2013-08-31 – 2013-09-01 (×3): via INTRAVENOUS

## 2013-08-31 MED ORDER — NALOXONE HCL 0.4 MG/ML IJ SOLN: 0.4000 mg | INTRAMUSCULAR | Status: DC | PRN

## 2013-08-31 MED ORDER — LACTATED RINGERS IV BOLUS (SEPSIS)
1000.0000 mL | Freq: Once | INTRAVENOUS | Status: AC
  Administered 2013-08-31: 1000 mL via INTRAVENOUS

## 2013-08-31 MED ORDER — EPHEDRINE SULFATE 50 MG/ML IJ SOLN
INTRAMUSCULAR | Status: DC | PRN
  Administered 2013-08-31: 10 mg via INTRAVENOUS

## 2013-08-31 MED ORDER — KETOROLAC TROMETHAMINE 30 MG/ML IJ SOLN
INTRAMUSCULAR | Status: AC
  Filled 2013-08-31: qty 1

## 2013-08-31 MED ORDER — DIPHENHYDRAMINE HCL 50 MG/ML IJ SOLN: 25.0000 mg | INTRAMUSCULAR | Status: DC | PRN

## 2013-08-31 MED ORDER — MORPHINE SULFATE 0.5 MG/ML IJ SOLN
INTRAMUSCULAR | Status: AC
  Filled 2013-08-31: qty 10

## 2013-08-31 MED ORDER — LORATADINE 10 MG PO TABS
10.0000 mg | ORAL_TABLET | Freq: Every day | ORAL | Status: AC
  Administered 2013-08-31 – 2013-09-02 (×3): 10 mg via ORAL
  Filled 2013-08-31 (×3): qty 1

## 2013-08-31 MED ORDER — MENTHOL 3 MG MT LOZG: 1.0000 | LOZENGE | OROMUCOSAL | Status: DC | PRN

## 2013-08-31 MED ORDER — LANOLIN HYDROUS EX OINT
1.0000 | TOPICAL_OINTMENT | CUTANEOUS | Status: DC | PRN
Start: 2013-08-31 — End: 2013-09-02

## 2013-08-31 MED ORDER — FENTANYL CITRATE 0.05 MG/ML IJ SOLN
INTRAMUSCULAR | Status: AC
  Filled 2013-08-31: qty 2

## 2013-08-31 MED ORDER — ONDANSETRON HCL 4 MG/2ML IJ SOLN
4.0000 mg | INTRAMUSCULAR | Status: DC | PRN
Start: 2013-08-31 — End: 2013-09-02

## 2013-08-31 MED ORDER — METOCLOPRAMIDE HCL 5 MG/ML IJ SOLN: 10.0000 mg | Freq: Three times a day (TID) | INTRAMUSCULAR | Status: DC | PRN

## 2013-08-31 MED ORDER — OXYTOCIN 10 UNIT/ML IJ SOLN
INTRAMUSCULAR | Status: AC
  Filled 2013-08-31: qty 4

## 2013-08-31 MED ORDER — DIBUCAINE 1 % RE OINT: 1.0000 "application " | TOPICAL_OINTMENT | RECTAL | Status: DC | PRN

## 2013-08-31 MED ORDER — MEASLES, MUMPS & RUBELLA VAC ~~LOC~~ INJ
0.5000 mL | INJECTION | Freq: Once | SUBCUTANEOUS | Status: DC
  Filled 2013-08-31: qty 0.5

## 2013-08-31 MED ORDER — NALBUPHINE HCL 10 MG/ML IJ SOLN: 5.0000 mg | INTRAMUSCULAR | Status: DC | PRN

## 2013-08-31 MED ORDER — ONDANSETRON HCL 4 MG PO TABS: 4.0000 mg | ORAL_TABLET | ORAL | Status: DC | PRN

## 2013-08-31 MED ORDER — DIPHENHYDRAMINE HCL 25 MG PO CAPS
25.0000 mg | ORAL_CAPSULE | ORAL | Status: DC | PRN
  Filled 2013-08-31: qty 1

## 2013-08-31 MED ORDER — EPHEDRINE 5 MG/ML INJ
INTRAVENOUS | Status: AC
  Filled 2013-08-31: qty 10

## 2013-08-31 MED ORDER — SIMETHICONE 80 MG PO CHEW
80.0000 mg | CHEWABLE_TABLET | ORAL | Status: DC
  Administered 2013-08-31 – 2013-09-01 (×2): 80 mg via ORAL
  Filled 2013-08-31 (×2): qty 1

## 2013-08-31 MED ORDER — CITRIC ACID-SODIUM CITRATE 334-500 MG/5ML PO SOLN
30.0000 mL | Freq: Once | ORAL | Status: AC
  Administered 2013-08-31: 30 mL via ORAL
  Filled 2013-08-31: qty 15

## 2013-08-31 MED ORDER — FENTANYL CITRATE 0.05 MG/ML IJ SOLN
INTRAMUSCULAR | Status: DC | PRN
  Administered 2013-08-31: 100 ug via EPIDURAL
  Administered 2013-08-31: 50 ug via INTRAVENOUS

## 2013-08-31 MED ORDER — SIMETHICONE 80 MG PO CHEW
80.0000 mg | CHEWABLE_TABLET | Freq: Three times a day (TID) | ORAL | Status: DC
  Administered 2013-09-01 – 2013-09-02 (×3): 80 mg via ORAL
  Filled 2013-08-31 (×3): qty 1

## 2013-08-31 MED ORDER — DEXTROSE 5 % IV SOLN
3.0000 g | INTRAVENOUS | Status: AC
  Administered 2013-08-31: 3 g via INTRAVENOUS
  Filled 2013-08-31: qty 3000

## 2013-08-31 MED ORDER — ALBUTEROL SULFATE (2.5 MG/3ML) 0.083% IN NEBU
INHALATION_SOLUTION | Freq: Four times a day (QID) | RESPIRATORY_TRACT | Status: DC | PRN
Start: 2013-08-31 — End: 2013-09-02

## 2013-08-31 MED ORDER — FAMOTIDINE IN NACL 20-0.9 MG/50ML-% IV SOLN: 20.0000 mg | Freq: Once | INTRAVENOUS | Status: DC

## 2013-08-31 SURGICAL SUPPLY — 31 items
CLAMP CORD UMBIL (MISCELLANEOUS) IMPLANT
CLOTH BEACON ORANGE TIMEOUT ST (SAFETY) ×1 IMPLANT
CONTAINER PREFILL 10% NBF 15ML (MISCELLANEOUS) IMPLANT
DRAPE LG THREE QUARTER DISP (DRAPES) IMPLANT
DRSG OPSITE POSTOP 4X10 (GAUZE/BANDAGES/DRESSINGS) ×1 IMPLANT
DRSG VASELINE 3X18 (GAUZE/BANDAGES/DRESSINGS) ×1 IMPLANT
DURAPREP 26ML APPLICATOR (WOUND CARE) ×1 IMPLANT
ELECT REM PT RETURN 9FT ADLT (ELECTROSURGICAL)
ELECTRODE REM PT RTRN 9FT ADLT (ELECTROSURGICAL) ×1 IMPLANT
EXTRACTOR VACUUM KIWI (MISCELLANEOUS) IMPLANT
EXTRACTOR VACUUM M CUP 4 TUBE (SUCTIONS) IMPLANT
GLOVE BIO SURGEON STRL SZ7.5 (GLOVE) ×1 IMPLANT
GOWN STRL REUS W/TWL LRG LVL3 (GOWN DISPOSABLE) ×2 IMPLANT
KIT ABG SYR 3ML LUER SLIP (SYRINGE) IMPLANT
NDL HYPO 25X5/8 SAFETYGLIDE (NEEDLE) IMPLANT
NEEDLE HYPO 25X5/8 SAFETYGLIDE (NEEDLE) IMPLANT
NS IRRIG 1000ML POUR BTL (IV SOLUTION) ×1 IMPLANT
PACK C SECTION WH (CUSTOM PROCEDURE TRAY) ×1 IMPLANT
PAD OB MATERNITY 4.3X12.25 (PERSONAL CARE ITEMS) ×1 IMPLANT
RTRCTR C-SECT PINK 25CM LRG (MISCELLANEOUS) IMPLANT
STAPLER VISISTAT 35W (STAPLE) IMPLANT
SUT PLAIN 0 NONE (SUTURE) IMPLANT
SUT VIC AB 0 CT1 36 (SUTURE) ×8 IMPLANT
SUT VIC AB 3-0 CTX 36 (SUTURE) ×1 IMPLANT
SUT VIC AB 3-0 SH 27 (SUTURE)
SUT VIC AB 3-0 SH 27X BRD (SUTURE) IMPLANT
SUT VIC AB 4-0 KS 27 (SUTURE) IMPLANT
SUT VICRYL 0 TIES 12 18 (SUTURE) IMPLANT
TOWEL OR 17X24 6PK STRL BLUE (TOWEL DISPOSABLE) ×1 IMPLANT
TRAY FOLEY CATH 14FR (SET/KITS/TRAYS/PACK) ×1 IMPLANT
WATER STERILE IRR 1000ML POUR (IV SOLUTION) ×1 IMPLANT

## 2013-08-31 SURGICAL SUPPLY — 39 items
CLAMP CORD UMBIL (MISCELLANEOUS) IMPLANT
CLOTH BEACON ORANGE TIMEOUT ST (SAFETY) ×2 IMPLANT
CONTAINER PREFILL 10% NBF 15ML (MISCELLANEOUS) IMPLANT
DRAPE LG THREE QUARTER DISP (DRAPES) IMPLANT
DRSG OPSITE POSTOP 4X10 (GAUZE/BANDAGES/DRESSINGS) ×2 IMPLANT
DRSG VASELINE 3X18 (GAUZE/BANDAGES/DRESSINGS) ×2 IMPLANT
DURAPREP 26ML APPLICATOR (WOUND CARE) ×1 IMPLANT
ELECT REM PT RETURN 9FT ADLT (ELECTROSURGICAL) ×2
ELECTRODE REM PT RTRN 9FT ADLT (ELECTROSURGICAL) ×1 IMPLANT
EXTRACTOR VACUUM KIWI (MISCELLANEOUS) IMPLANT
EXTRACTOR VACUUM M CUP 4 TUBE (SUCTIONS) IMPLANT
GAUZE VASELINE 3X9 (GAUZE/BANDAGES/DRESSINGS) ×1 IMPLANT
GLOVE BIO SURGEON STRL SZ7.5 (GLOVE) ×1 IMPLANT
GLOVE BIOGEL PI IND STRL 7.5 (GLOVE) IMPLANT
GLOVE BIOGEL PI INDICATOR 7.5 (GLOVE) ×1
GLOVE SS PI 9.0 STRL (GLOVE) ×1 IMPLANT
GLOVE SURG SS PI 7.5 STRL IVOR (GLOVE) ×1 IMPLANT
GOWN STRL REUS W/TWL LRG LVL3 (GOWN DISPOSABLE) ×6 IMPLANT
KIT ABG SYR 3ML LUER SLIP (SYRINGE) IMPLANT
NDL HYPO 25X5/8 SAFETYGLIDE (NEEDLE) IMPLANT
NEEDLE HYPO 25X5/8 SAFETYGLIDE (NEEDLE) IMPLANT
NS IRRIG 1000ML POUR BTL (IV SOLUTION) ×2 IMPLANT
PACK C SECTION WH (CUSTOM PROCEDURE TRAY) ×2 IMPLANT
PAD OB MATERNITY 4.3X12.25 (PERSONAL CARE ITEMS) ×2 IMPLANT
RTRCTR C-SECT PINK 25CM LRG (MISCELLANEOUS) IMPLANT
SPONGE GAUZE 4X4 12PLY STER LF (GAUZE/BANDAGES/DRESSINGS) ×1 IMPLANT
STAPLER VISISTAT 35W (STAPLE) IMPLANT
SUT PLAIN 0 NONE (SUTURE) IMPLANT
SUT VIC AB 0 CT1 36 (SUTURE) ×16 IMPLANT
SUT VIC AB 2-0 SH 27 (SUTURE) ×6
SUT VIC AB 2-0 SH 27XBRD (SUTURE) IMPLANT
SUT VIC AB 3-0 CTX 36 (SUTURE) ×2 IMPLANT
SUT VIC AB 3-0 SH 27 (SUTURE) ×2
SUT VIC AB 3-0 SH 27X BRD (SUTURE) IMPLANT
SUT VIC AB 4-0 KS 27 (SUTURE) IMPLANT
SUT VICRYL 0 TIES 12 18 (SUTURE) IMPLANT
TOWEL OR 17X24 6PK STRL BLUE (TOWEL DISPOSABLE) ×2 IMPLANT
TRAY FOLEY CATH 14FR (SET/KITS/TRAYS/PACK) ×2 IMPLANT
WATER STERILE IRR 1000ML POUR (IV SOLUTION) ×2 IMPLANT

## 2013-08-31 NOTE — Lactation Note (Signed)
This note was copied from the chart of Girl Manuel Lawhead. Lactation Consultation Note  Patient Name: Girl Khristina Janota [id number] Date: 08/31/2013 Reason for consult: Initial assessment of this mom and baby dyad at 14 hours postpartum.  She is an  experienced multipara and states she nursed one child for 4 months and one for 8 months without difficulties.  LC reviewed hand expression and encouraged STS and cue feedings.LC encouraged review of Baby and Me pp 9, 14 and 20-25 for STS and BF information. LC provided [employer] brochure and reviewed [facility] services and list of community and web site resources.     Maternal Data Formula Feeding for Exclusion: No Infant to breast within first hour of birth: Yes Has patient been taught Hand Expression?: Yes ([place] reviewed technqiue and reason to express colostrum/milk) Does the patient have breastfeeding experience prior to this delivery?: Yes  Feeding Feeding Type: Breast Fed Length of feed: 15 min  LATCH Score/Interventions Latch: Grasps breast easily, tongue down, lips flanged, rhythmical sucking.  Audible Swallowing: A few with stimulation Intervention(s): Skin to skin;Hand expression  Type of Nipple: Everted at rest and after stimulation  Comfort (Breast/Nipple): Soft / non-tender     Hold (Positioning): No assistance needed to correctly position infant at breast.  LATCH Score: 9 (most recent feeding assessment, per RN)  Lactation Tools Discussed/Used   STS, cue feedings, hand expression  Consult Status Consult Status: Follow-up Date: 09/01/13 Follow-up type: In-patient    Landis Gandy 08/31/2013, 8:18 PM

## 2013-08-31 NOTE — Op Note (Signed)
NAMETIMBERLEE, ROBLERO NO.:  [id number]  MEDICAL RECORD NO.:  [id number]  LOCATION:  [facility]                          FACILITY:  [facility]  PHYSICIAN:  Lucille Passy. [name], M.D. DATE OF BIRTH:  1975-05-24  DATE OF PROCEDURE:  08/31/2013 DATE OF DISCHARGE:                              OPERATIVE REPORT   PREOPERATIVE DIAGNOSES:  Intrauterine pregnancy at 38 weeks and 6 days with premature rupture of the membranes; 2 prior C-sections; gestational diabetes mellitus, on glyburide; voluntary sterilization.  POSTOPERATIVE DIAGNOSES:  Intrauterine pregnancy at 38 weeks and 6 days with premature rupture of the membranes; 2 prior C-sections; gestational diabetes mellitus, on glyburide; voluntary sterilization.  Significant adhesions in the abdomen binding the uterus to the abdominal wall toward the umbilicus.  Dense adhesions in the anterior cul-de-sac and along the tubes and ovaries.  OPERATION:  Low-transverse cervical C-section and bilateral fimbriectomy.  OPERATOR:  Lucille Passy. [name], M.D.  Terrence DupontGlo Herring.  ANESTHESIA:  Spinal and epidural anesthesia.  DESCRIPTION OF PROCEDURE:  The patient was brought to the operating room and given a spinal and epidural by Dr. Lyndle Herrlich.  Shortly after the anesthetic was administered, the patient began complaining of a severe headache and the fetal heart rate dropped into the 70s.  It did not recover, so DuraPrep was not used.  A very quick abdominal prep with Betadine was done.  Foley catheter was inserted and the abdomen was draped as a sterile field.  A transverse incision was made through the old scar, carried in layers through the skin, subcutaneous tissue, and fascia.  The peritoneum was entered.  The peritoneal opening was stretched.  The bladder blade was used.  A small incision was made in the superficial layers of the myometrium.  I went the rest of the way into the amniotic sac with my finger.  Clear fluid was  obtained.  Some cord came out.  I elevated the vertex into the incisional opening and delivered the baby.  The baby seemed to be a little bit limp, stimulated it, clamped the cord, gave it to Dr. Berenice Bouton, who was in attendance and he assigned the infant Apgars of 8 and 9 at 1 and 5 minutes.  At this time, Dr. Glo Herring scrubbed in.  I tried to do an arterial pH on the cord, but I could not get enough blood from the arteries so I combined the artery and venous blood.  I then re-took routine cord blood studies, removed the entire placenta, and some of the membranes were a little bit difficult to remove, but they were removed.  Then, the uterus was closed in 2 layers using a running lock suture of 0 Vicryl on the first layer, nonlocking suture of the same material on the second layer. There were significant adhesions around the tubes.  Identified the end of both tubes and both ovaries which appeared normal, and I took probably 2 inches of the distal tube out on both sides.  There was no significant bleeding on either side.  There was a little bleeding on the right.  That was sutured with 2-0 Vicryl.  Liberal irrigation confirmed hemostasis.  The retractors were removed and  the abdominal wall was closed in layers using interrupted sutures of 0 Vicryl to close the peritoneum and rectus muscle in 1 layer, 2 running sutures of 0 Vicryl on the fascia, running 3-0 Vicryl on the subcutaneous tissue, and staples on the skin.  The patient seemed to tolerate the procedure well.  Blood loss was estimated at 1000 mL.  Sponge and needle counts were correct, and she was returned to Recovery in satisfactory condition.     Lucille Passy. [name], M.D.     TFH/MEDQ  D:  08/31/2013  T:  08/31/2013  Job:  [id number]

## 2013-08-31 NOTE — Discharge Instructions (Signed)
Fetal Movement Counts Patient Name: __________________________________________________ Patient Due Date: ____________________ Performing a fetal movement count is highly recommended in high-risk pregnancies, but it is good for every pregnant woman to do. Your caregiver may ask you to start counting fetal movements at 28 weeks of the pregnancy. Fetal movements often increase:  After eating a full meal.  After physical activity.  After eating or drinking something sweet or cold.  At rest. Pay attention to when you feel the baby is most active. This will help you notice a pattern of your baby's sleep and wake cycles and what factors contribute to an increase in fetal movement. It is important to perform a fetal movement count at the same time each day when your baby is normally most active.  HOW TO COUNT FETAL MOVEMENTS 1. Find a quiet and comfortable area to sit or lie down on your left side. Lying on your left side provides the best blood and oxygen circulation to your baby. 2. Write down the day and time on a sheet of paper or in a journal. 3. Start counting kicks, flutters, swishes, rolls, or jabs in a 2 hour period. You should feel at least 10 movements within 2 hours. 4. If you do not feel 10 movements in 2 hours, wait 2 3 hours and count again. Look for a change in the pattern or not enough counts in 2 hours. SEEK MEDICAL CARE IF:  You feel less than 10 counts in 2 hours, tried twice.  There is no movement in over an hour.  The pattern is changing or taking longer each day to reach 10 counts in 2 hours.  You feel the baby is not moving as he or she usually does. Date: ____________ Movements: ____________ Start time: ____________ Kathleen Weaver time: ____________  Date: ____________ Movements: ____________ Start time: ____________ Kathleen Weaver time: ____________ Date: ____________ Movements: ____________ Start time: ____________ Kathleen Weaver time: ____________ Date: ____________ Movements: ____________  Start time: ____________ Kathleen Weaver time: ____________ Date: ____________ Movements: ____________ Start time: ____________ Kathleen Weaver time: ____________ Date: ____________ Movements: ____________ Start time: ____________ Kathleen Weaver time: ____________ Date: ____________ Movements: ____________ Start time: ____________ Kathleen Weaver time: ____________ Date: ____________ Movements: ____________ Start time: ____________ Kathleen Weaver time: ____________  Date: ____________ Movements: ____________ Start time: ____________ Kathleen Weaver time: ____________ Date: ____________ Movements: ____________ Start time: ____________ Kathleen Weaver time: ____________ Date: ____________ Movements: ____________ Start time: ____________ Kathleen Weaver time: ____________ Date: ____________ Movements: ____________ Start time: ____________ Kathleen Weaver time: ____________ Date: ____________ Movements: ____________ Start time: ____________ Kathleen Weaver time: ____________ Date: ____________ Movements: ____________ Start time: ____________ Kathleen Weaver time: ____________ Date: ____________ Movements: ____________ Start time: ____________ Kathleen Weaver time: ____________  Date: ____________ Movements: ____________ Start time: ____________ Kathleen Weaver time: ____________ Date: ____________ Movements: ____________ Start time: ____________ Kathleen Weaver time: ____________ Date: ____________ Movements: ____________ Start time: ____________ Kathleen Weaver time: ____________ Date: ____________ Movements: ____________ Start time: ____________ Kathleen Weaver time: ____________ Date: ____________ Movements: ____________ Start time: ____________ Kathleen Weaver time: ____________ Date: ____________ Movements: ____________ Start time: ____________ Kathleen Weaver time: ____________ Date: ____________ Movements: ____________ Start time: ____________ Kathleen Weaver time: ____________  Date: ____________ Movements: ____________ Start time: ____________ Kathleen Weaver time: ____________ Date: ____________ Movements: ____________ Start time: ____________ Kathleen Weaver time:  ____________ Date: ____________ Movements: ____________ Start time: ____________ Kathleen Weaver time: ____________ Date: ____________ Movements: ____________ Start time: ____________ Kathleen Weaver time: ____________ Date: ____________ Movements: ____________ Start time: ____________ Kathleen Weaver time: ____________ Date: ____________ Movements: ____________ Start time: ____________ Kathleen Weaver time: ____________ Date: ____________ Movements: ____________ Start time: ____________ Kathleen Weaver time: ____________  Date: ____________ Movements: ____________ Start time: ____________ Kathleen Weaver  time: ____________ Date: ____________ Movements: ____________ Start time: ____________ Kathleen Weaver time: ____________ Date: ____________ Movements: ____________ Start time: ____________ Kathleen Weaver time: ____________ Date: ____________ Movements: ____________ Start time: ____________ Kathleen Weaver time: ____________ Date: ____________ Movements: ____________ Start time: ____________ Kathleen Weaver time: ____________ Date: ____________ Movements: ____________ Start time: ____________ Kathleen Weaver time: ____________ Date: ____________ Movements: ____________ Start time: ____________ Kathleen Weaver time: ____________  Date: ____________ Movements: ____________ Start time: ____________ Kathleen Weaver time: ____________ Date: ____________ Movements: ____________ Start time: ____________ Kathleen Weaver time: ____________ Date: ____________ Movements: ____________ Start time: ____________ Kathleen Weaver time: ____________ Date: ____________ Movements: ____________ Start time: ____________ Kathleen Weaver time: ____________ Date: ____________ Movements: ____________ Start time: ____________ Kathleen Weaver time: ____________ Date: ____________ Movements: ____________ Start time: ____________ Kathleen Weaver time: ____________ Date: ____________ Movements: ____________ Start time: ____________ Kathleen Weaver time: ____________  Date: ____________ Movements: ____________ Start time: ____________ Kathleen Weaver time: ____________ Date: ____________ Movements:  ____________ Start time: ____________ Kathleen Weaver time: ____________ Date: ____________ Movements: ____________ Start time: ____________ Kathleen Weaver time: ____________ Date: ____________ Movements: ____________ Start time: ____________ Kathleen Weaver time: ____________ Date: ____________ Movements: ____________ Start time: ____________ Kathleen Weaver time: ____________ Date: ____________ Movements: ____________ Start time: ____________ Kathleen Weaver time: ____________ Date: ____________ Movements: ____________ Start time: ____________ Kathleen Weaver time: ____________  Date: ____________ Movements: ____________ Start time: ____________ Kathleen Weaver time: ____________ Date: ____________ Movements: ____________ Start time: ____________ Kathleen Weaver time: ____________ Date: ____________ Movements: ____________ Start time: ____________ Kathleen Weaver time: ____________ Date: ____________ Movements: ____________ Start time: ____________ Kathleen Weaver time: ____________ Date: ____________ Movements: ____________ Start time: ____________ Kathleen Weaver time: ____________ Date: ____________ Movements: ____________ Start time: ____________ Kathleen Weaver time: ____________ Document Released: 05/27/2006 Document Revised: 04/13/2012 Document Reviewed: 02/22/2012 ExitCare Patient Information 2014 [place].  Braxton Hicks Contractions Pregnancy is commonly associated with contractions of the uterus throughout the pregnancy. Towards the end of pregnancy (32 to 34 weeks), these contractions [facility]mael Holter) can develop more often and may become more forceful. This is not true labor because these contractions do not result in opening (dilatation) and thinning of the cervix. They are sometimes difficult to tell apart from true labor because these contractions can be forceful and people have different pain tolerances. You should not feel embarrassed if you go to the hospital with false labor. Sometimes, the only way to tell if you are in true labor is for your caregiver to follow the changes in  the cervix. How to tell the difference between true and false labor:  False labor.  The contractions of false labor are usually shorter, irregular and not as hard as those of true labor.  They are often felt in the front of the lower abdomen and in the groin.  They may leave with walking around or changing positions while lying down.  They get weaker and are shorter lasting as time goes on.  These contractions are usually irregular.  They do not usually become progressively stronger, regular and closer together as with true labor.  True labor.  Contractions in true labor last 30 to 70 seconds, become very regular, usually become more intense, and increase in frequency.  They do not go away with walking.  The discomfort is usually felt in the top of the uterus and spreads to the lower abdomen and low back.  True labor can be determined by your caregiver with an exam. This will show that the cervix is dilating and getting thinner. If there are no prenatal problems or other health problems associated with the pregnancy, it is completely safe to be sent home with false labor and await the onset of true  true labor. °HOME CARE INSTRUCTIONS  °· Keep up with your usual exercises and instructions. °· Take medications as directed. °· Keep your regular prenatal appointment. °· Eat and drink lightly if you think you are going into labor. °· If BH contractions are making you uncomfortable: °· Change your activity position from lying down or resting to walking/walking to resting. °· Sit and rest in a tub of warm water. °· Drink 2 to 3 glasses of water. Dehydration may cause B-H contractions. °· Do slow and deep breathing several times an hour. °SEEK IMMEDIATE MEDICAL CARE IF:  °· Your contractions continue to become stronger, more regular, and closer together. °· You have a gushing, burst or leaking of fluid from the vagina. °· An oral temperature above 102° F (38.9° C) develops. °· You have passage of  blood-tinged mucus. °· You develop vaginal bleeding. °· You develop continuous belly (abdominal) pain. °· You have low back pain that you never had before. °· You feel the baby's head pushing down causing pelvic pressure. °· The baby is not moving as much as it used to. °Document Released: 04/27/2005 Document Revised: 07/20/2011 Document Reviewed: 02/06/2013 °ExitCare® Patient Information ©2014 [employer]. ° ° °

## 2013-08-31 NOTE — Addendum Note (Signed)
Addendum created 08/31/13 1617 by Brock Ra, CRNA   Modules edited: Notes Section   Notes Section:  File: [id number]

## 2013-08-31 NOTE — H&P (Signed)
Kathleen Weaver, TRUAX NO.:  [id number]  MEDICAL RECORD NO.:  [id number]  LOCATION:  BS49                          FACILITY:  [facility]  PHYSICIAN:  Lucille Passy. [name], M.D. DATE OF BIRTH:  10-30-1975  DATE OF ADMISSION:  08/31/2013 DATE OF DISCHARGE:                             HISTORY & PHYSICAL   PRESENT ILLNESS:  This is a 38 year old white female, para 2-0-3-2, gravida 6, with EDC Sep 08, 2013, admitted with ruptured membranes, with 2 prior C-sections, and is being taken to the OR for repeat cesarean section and tubal ligation or bilateral salpingectomy.  The patient began her prenatal course at 10 weeks.  Her blood group and type is A positive with a negative antibody, VDRL negative, urine culture negative,  hepatitis B surface antigen negative, HIV negative, GC and Chlamydia negative.  Rubella immune.  Ultrasound on February 06, 2013, 9 weeks 5 days, [facility] September 06, 2013, [facility] of Sep 08, 2013, is by the last menstrual period.  First trimester screen normal.  The patient had a small fibroid 3.23 x 2 cm in the left lower uterine segment.  Second trimester screen for AFP was negative.  One hour Glucola 146, 3-hour GTT 99, 198, 173 and 87.  The patient was informed and advised to go to a nutrition and diabetic counseling.  Her group B strep was negative.  TSH was 3.47.  The patient was noted to have a fundal cervix on April 19, 2013.  Cervical length on May 02, 2013, was 1.6 cm.  It was stable with a cerclage.  A 3.6 x 3.9 x 2.8 cm posterior fibroid was noted. After her prenatal course began, the patient was noted on ultrasound to have the fibroid.  The patient had been noted to have a blood pressure 154/90 at 16 weeks.  Blood pressure then became pretty much normal.  She had a cerclage placed.At 29 weeks and 6 days fundal height was noted to be 33 cm.  The patient was scheduled for a C-section on September 01, 2013. At 30 weeks and 6 days, her fasting blood sugars  were 98-127, 2-hour breakfast were 88-103, 2 hours lunch were 76-122 hours, supper 111-141, and was started on glyburide 1.25 mg every morning.  The patient had some lesions on her vulva that were cultured and herpes culture was negative.  On July 12, 2013, the patient had 1.25 mg of glyburide added at night.  Ultrasound showed an AFI of 17.  Estimated fetal weight in the 69th percentile and breech presentation.  The patient was begun on nonstress tests twice a week.  They have been reactive.  Subsequently, she was changed to 2.5 mg glyburide at night and left at 1.25 in the morning.  On July 27, 2013, she was on 3.75 at night.  On August 03, 2013, overall the blood sugars looked okay.  Her glyburide was kept the same.  The cerclage was removed on August 10, 2013.  Blood sugars on August 21, 2013, were slightly more abnormal.  The evening glyburide was increased to 5 mg.  Subsequently, it was  decreased at 3.75 mg at night on August 15, 2013, and that is what she  is on now.  The patient was observed in Labor and Delivery for 6 hours between 8 p.m. on the 22nd and 2 a.m. on the 23rd.  It was determined that she was not in labor. She went home, had gross rupture of membranes at 4 a.m. returned and is now prepared for C-section and tubal ligation or removal of the tubes.  PAST MEDICAL HISTORY:  Borderline high blood pressure, possibly slight hyperthyroidism, but did not start medication.  TSH during pregnancy was normal.  She has had a history of asthmatic bronchitis.  SOCIAL HISTORY:  Former smoker.  No alcohol.  Denies illicit drugs. Prepregnancy did smoke some marijuana, 12 years of education, works as an Nurse, adult at [employer].  PAST SURGICAL HISTORY:  She had D and Cs for therapeutic abortion x2. She has had a LEEP procedure on her cervix.  Bartholin gland marsupialization and 2 C-sections.  ALLERGIES:  To LATEX and LISINOPRIL.  No other drug  allergies.  FAMILY HISTORY:  Mother with cancer of the cervix, high blood pressure and atrial fibrillation.  Maternal grandfather; coronary artery disease, diabetes, high blood pressure.  Paternal grandfather; coronary artery disease, high blood pressure.  Paternal grandmother; diabetes and an unspecified relation, had a complete trisomy 21 syndrome.  Husband's third cousin has Down syndrome.  PHYSICAL EXAMINATION:  VITAL SIGNS:  On admission, temperature 98.1, pulse 77, respirations 18, blood pressure 143/72. HEART:  Normal size and sounds.  No murmurs. LUNGS:  Clear to auscultation.  At her last prenatal visit on August 28, 2013, her fundal height was 40 cm.  Fetal heart tones are normal.  Her weight is 295 pounds. CERVIX:  Not reexamined, but earlier tonight.  The cervix was at 2 cm, 50%, vertex at a -4.  ADMITTING IMPRESSION:  Intrauterine pregnancy at 38 weeks and 6 days, 2 prior cesarean sections, premature rupture of the membranes, voluntary sterilization, gestational diabetes mellitus on glyburide.  The patient is prepared for repeat cesarean section and either removal of the tubes or tubal ligation.     Lucille Passy. [name], M.D.     TFH/MEDQ  D:  08/31/2013  T:  08/31/2013  Job:  [id number]

## 2013-08-31 NOTE — Anesthesia Preprocedure Evaluation (Signed)
Anesthesia Evaluation  Patient identified by MRN, date of birth, ID band Patient awake    Reviewed: Allergy & Precautions, H&P , NPO status , Patient's Chart, lab work & pertinent test results, reviewed documented beta blocker date and time   History of Anesthesia Complications Negative for: history of anesthetic complications  Airway Mallampati: II TM Distance: >3 FB Neck ROM: full    Dental  (+) Teeth Intact   Pulmonary asthma (allergy-related) , former smoker,  breath sounds clear to auscultation        Cardiovascular hypertension, negative cardio ROS  Rhythm:regular Rate:Normal     Neuro/Psych negative neurological ROS  negative psych ROS   GI/Hepatic Neg liver ROS, GERD-  Medicated,  Endo/Other  diabetes, GestationalMorbid obesity  Renal/GU negative Renal ROS  negative genitourinary   Musculoskeletal   Abdominal   Peds  Hematology negative hematology ROS (+)   Anesthesia Other Findings   Reproductive/Obstetrics (+) Pregnancy (20 weeks, cervical shortening)                           Anesthesia Physical  Anesthesia Plan  ASA: III  Anesthesia Plan: Spinal   Post-op Pain Management:    Induction:   Airway Management Planned:   Additional Equipment:   Intra-op Plan:   Post-operative Plan:   Informed Consent: I have reviewed the patients History and Physical, chart, labs and discussed the procedure including the risks, benefits and alternatives for the proposed anesthesia with the patient or authorized representative who has indicated his/her understanding and acceptance.     Plan Discussed with: Surgeon and CRNA  Anesthesia Plan Comments:         Anesthesia Quick Evaluation

## 2013-08-31 NOTE — MAU Note (Signed)
Pt presented grossly ruptured at 0400.  States contractions have increased in intensity and are about 2-3 minutes apart.

## 2013-08-31 NOTE — Anesthesia Procedure Notes (Signed)
Spinal  Patient location during procedure: OR Preanesthetic Checklist Completed: patient identified, site marked, surgical consent, pre-op evaluation, timeout performed, IV checked, risks and benefits discussed and monitors and equipment checked Spinal Block Patient position: sitting Prep: DuraPrep Patient monitoring: cardiac monitor, continuous pulse ox, blood pressure and heart rate Approach: midline Location: L3-4 Injection technique: catheter Needle Needle type: Tuohy and Sprotte  Needle gauge: 24 G Needle length: 12.7 cm Needle insertion depth: 9 cm Catheter type: closed end flexible Catheter size: 19 g Catheter at skin depth: 15 cm Additional Notes sprotte thru tuohy  Spinal Dosage in OR  Bupivicaine ml       1.8 PFMS04   mcg        150

## 2013-08-31 NOTE — Transfer of Care (Signed)
Immediate Anesthesia Transfer of Care Note  Patient: Kathleen Weaver  Procedure(s) Performed: Procedure(s): CESAREAN SECTION (N/A)  Patient Location: PACU  Anesthesia Type:Spinal and Epidural  Level of Consciousness: awake, alert  and oriented  Airway & Oxygen Therapy: Patient Spontanous Breathing  Post-op Assessment: Report given to PACU RN and Post -op Vital signs reviewed and stable  Post vital signs: Reviewed and stable  Complications: No apparent anesthesia complications

## 2013-08-31 NOTE — Anesthesia Postprocedure Evaluation (Signed)
Anesthesia Post Note  Patient: Kathleen Weaver  Procedure(s) Performed: Procedure(s) (LRB): CESAREAN SECTION (N/A)  Anesthesia type: Spinal  Patient location: PACU  Post pain: Pain level controlled  Post assessment: Post-op Vital signs reviewed  Last Vitals:  Filed Vitals:   08/31/13 0710  Pulse: 70  Temp: 36.4 C    Post vital signs: Reviewed  Level of consciousness: awake  Complications: No apparent anesthesia complications

## 2013-08-31 NOTE — Anesthesia Postprocedure Evaluation (Signed)
  Anesthesia Post-op Note  Patient: Kathleen Weaver  Procedure(s) Performed: Procedure(s): CESAREAN SECTION (N/A)  Patient Location: Mother/Baby  Anesthesia Type:Spinal  Level of Consciousness: awake, alert , oriented and patient cooperative  Airway and Oxygen Therapy: Patient Spontanous Breathing  Post-op Pain: mild  Post-op Assessment: Post-op Vital signs reviewed, Patient's Cardiovascular Status Stable, Respiratory Function Stable, Patent Airway, No signs of Nausea or vomiting, Adequate PO intake and Pain level controlled  Post-op Vital Signs: Reviewed and stable  Last Vitals:  Filed Vitals:   08/31/13 1315  BP: 112/56  Pulse: 63  Temp: 36.6 C  Resp: 20    Complications: No apparent anesthesia complications

## 2013-08-31 NOTE — Addendum Note (Signed)
Addendum created 08/31/13 0830 by Rudean Curt, MD   Modules edited: Anesthesia Responsible Staff, Orders, PRL Based Order Sets

## 2013-09-01 ENCOUNTER — Encounter (HOSPITAL_COMMUNITY): Payer: Self-pay | Admitting: Obstetrics and Gynecology

## 2013-09-01 LAB — CBC
HCT: 26.7 % — ABNORMAL LOW (ref 36.0–46.0)
Hemoglobin: 8.9 g/dL — ABNORMAL LOW (ref 12.0–15.0)
MCH: 30.3 pg (ref 26.0–34.0)
MCHC: 33.3 g/dL (ref 30.0–36.0)
MCV: 90.8 fL (ref 78.0–100.0)
Platelets: 197 10*3/uL (ref 150–400)
RBC: 2.94 MIL/uL — AB (ref 3.87–5.11)
RDW: 14.7 % (ref 11.5–15.5)
WBC: 7.5 10*3/uL (ref 4.0–10.5)

## 2013-09-01 LAB — GLUCOSE, CAPILLARY: Glucose-Capillary: 77 mg/dL (ref 70–99)

## 2013-09-01 MED ORDER — DEXTROSE 5 % IV SOLN: 3.0000 g | INTRAVENOUS | Status: DC

## 2013-09-01 MED ORDER — LACTATED RINGERS IV SOLN: INTRAVENOUS | Status: DC

## 2013-09-01 NOTE — Lactation Note (Signed)
This note was copied from the chart of Girl Saida Lonon. Lactation Consultation Note  Patient Name: Girl Jovonna Nickell [id number] Date: 09/01/2013 Reason for consult: Follow-up assessment of this mom and baby, now 35 hours postpartum.  Mom reports that baby is latching but is not sure if she is getting enough milk.  Most feedings are for 10-20 minutes on one or both breasts and baby having copious output which exceeds typical output for this hour of life.  LC recommends breast compression and intermittent stimulation during feeding if sucking not rhythmical and strong for at least 5 sucks/burst.  LC also discussed that output indicates sufficient intake.  LC encouraged continued cue feedings and mom to call for help as needed.   Maternal Data    Feeding Feeding Type: Breast Fed Length of feed: 20 min  LATCH Score/Interventions      Most recent LATCH score=9 and consistent scores of 8/9 since birth                Lactation Tools Discussed/Used   Cue feedings Normal output based on baby's hour of life Signs of milk transfer  Consult Status Consult Status: Follow-up Date: 09/02/13 Follow-up type: In-patient    Landis Gandy 09/01/2013, 5:18 PM

## 2013-09-01 NOTE — Progress Notes (Signed)
Patient ID: Kathleen Weaver, female   DOB: Oct 28, 1975, 38 y.o.   MRN: [id number] #1 afebrile BP normal HGB 11.2 to 8.9. Tolerating a diet but no flatus. Voiding well  Abdomen soft and not tender

## 2013-09-02 MED ORDER — IBUPROFEN 600 MG PO TABS: 600.0000 mg | ORAL_TABLET | Freq: Four times a day (QID) | ORAL | Status: AC

## 2013-09-02 MED ORDER — OXYCODONE-ACETAMINOPHEN 5-325 MG PO TABS: 1.0000 | ORAL_TABLET | ORAL | Status: DC | PRN

## 2013-09-02 NOTE — Discharge Summary (Signed)
Obstetric Discharge Summary Reason for Admission: rupture of membranes Prenatal Procedures: NST Intrapartum Procedures: cesarean: low cervical, transverse and bilateral fimbriectomy Postpartum Procedures: none Complications-Operative and Postpartum: none Hemoglobin  Date Value Ref Range Status  09/01/2013 8.9* 12.0 - 15.0 g/dL Final     DELTA CHECK NOTED     REPEATED TO VERIFY     HCT  Date Value Ref Range Status  09/01/2013 26.7* 36.0 - 46.0 % Final    Physical Exam:  General: alert and cooperative Lochia: appropriate Uterine Fundus: firm Incision: C/D/I   Discharge Diagnoses: Term Pregnancy-delivered                                         Gestational Diabetes                                         Desired Sterilization   Discharge Information: Date: 09/02/2013 Activity: pelvic rest Diet: routine Medications: Ibuprofen and Percocet Condition: improved Instructions: refer to practice specific booklet Discharge to: home Follow-up Information   Follow up with Melina Schools, MD In 5 days. (staple removal)    Specialty:  Obstetrics and Gynecology   Contact information:   [address], [place], [place] [address] [phone]       Newborn Data: Live born female  Birth Weight: 8 lb 2.7 oz (3705 g) APGAR: 8, 9  Home with mother.  Logan Bores 09/02/2013, 10:25 AM

## 2013-09-02 NOTE — Progress Notes (Signed)
Subjective: Postpartum Day 2: Cesarean Delivery Patient reports tolerating PO and no problems voiding.    Objective: Vital signs in last 24 hours: Temp:  [97.9 F (36.6 C)-99.1 F (37.3 C)] 97.9 F (36.6 C) (04/25 0616) Pulse Rate:  [79-83] 79 (04/25 0616) Resp:  [18] 18 (04/25 0616) BP: (132-143)/(63-78) 143/63 mmHg (04/25 0616) SpO2:  [98 %] 98 % (04/25 0616)  Physical Exam:  General: alert and cooperative Lochia: appropriate Uterine Fundus: firm Incision: C/D/I    Recent Labs  08/31/13 0454 09/01/13 0635  HGB 11.2* 8.9*  HCT 33.1* 26.7*    Assessment/Plan: Status post Cesarean section. Doing well postoperatively.  D/c home Kathleen Weaver 09/02/2013, 10:24 AM

## 2013-09-02 NOTE — Plan of Care (Signed)
Problem: Discharge Progression Outcomes Goal: Remove staples per MD order Outcome: Not Applicable Date Met:  44/65/20 Staples to be removed at office.

## 2013-09-02 NOTE — Lactation Note (Signed)
This note was copied from the chart of Kathleen Weaver. Lactation Consultation Note Follow up consult:  7.7% Weight loss.  BF has improved.  Reviewed waking techniques.  Mother's breasts are filling. Mother has been bf for a few minutes on one side and then switching breasts. Recommend she feed on one breast at a time so baby receives hind milk for weight gain. Mom encouraged to feed baby 8-12 times/24 hours and with feeding cues for longer than 10 min.  Mother has a history of thrush.  Reviewed symptoms and recommend she call MD if she notices signs. Reviewed engorgement care and answered pumping questions. Encouraged mother to call if she has further questions.  Patient Name: Kathleen Weaver Date: 09/02/2013 Reason for consult: Follow-up assessment   Maternal Data    Feeding Feeding Type: Breast Fed Length of feed: 10 min  LATCH Score/Interventions                      Lactation Tools Discussed/Used     Consult Status Consult Status: Complete    Carlye Grippe 09/02/2013, 10:35 AM

## 2013-10-02 DIAGNOSIS — E282 Polycystic ovarian syndrome: Secondary | ICD-10-CM | POA: Insufficient documentation

## 2013-10-02 DIAGNOSIS — J45909 Unspecified asthma, uncomplicated: Secondary | ICD-10-CM | POA: Insufficient documentation

## 2013-10-02 DIAGNOSIS — F419 Anxiety disorder, unspecified: Secondary | ICD-10-CM | POA: Insufficient documentation

## 2013-10-02 DIAGNOSIS — E785 Hyperlipidemia, unspecified: Secondary | ICD-10-CM | POA: Insufficient documentation

## 2013-10-02 DIAGNOSIS — F988 Other specified behavioral and emotional disorders with onset usually occurring in childhood and adolescence: Secondary | ICD-10-CM | POA: Insufficient documentation

## 2013-10-02 DIAGNOSIS — K219 Gastro-esophageal reflux disease without esophagitis: Secondary | ICD-10-CM | POA: Insufficient documentation

## 2013-10-03 ENCOUNTER — Ambulatory Visit (INDEPENDENT_AMBULATORY_CARE_PROVIDER_SITE_OTHER): Payer: PRIVATE HEALTH INSURANCE | Admitting: Emergency Medicine

## 2013-10-03 ENCOUNTER — Other Ambulatory Visit: Payer: Self-pay | Admitting: Emergency Medicine

## 2013-10-03 ENCOUNTER — Encounter: Payer: Self-pay | Admitting: Emergency Medicine

## 2013-10-03 VITALS — BP 160/84 | HR 96 | Temp 98.6°F | Resp 18 | Ht 68.25 in | Wt 275.0 lb

## 2013-10-03 DIAGNOSIS — R5381 Other malaise: Secondary | ICD-10-CM

## 2013-10-03 DIAGNOSIS — R7309 Other abnormal glucose: Secondary | ICD-10-CM

## 2013-10-03 DIAGNOSIS — O24419 Gestational diabetes mellitus in pregnancy, unspecified control: Secondary | ICD-10-CM

## 2013-10-03 DIAGNOSIS — E782 Mixed hyperlipidemia: Secondary | ICD-10-CM

## 2013-10-03 DIAGNOSIS — I10 Essential (primary) hypertension: Secondary | ICD-10-CM

## 2013-10-03 DIAGNOSIS — R5383 Other fatigue: Secondary | ICD-10-CM

## 2013-10-03 DIAGNOSIS — D649 Anemia, unspecified: Secondary | ICD-10-CM

## 2013-10-03 LAB — CBC WITH DIFFERENTIAL/PLATELET
BASOS PCT: 0 % (ref 0–1)
Basophils Absolute: 0 10*3/uL (ref 0.0–0.1)
Eosinophils Absolute: 0.3 10*3/uL (ref 0.0–0.7)
Eosinophils Relative: 4 % (ref 0–5)
HEMATOCRIT: 33 % — AB (ref 36.0–46.0)
Hemoglobin: 10.8 g/dL — ABNORMAL LOW (ref 12.0–15.0)
Lymphocytes Relative: 25 % (ref 12–46)
Lymphs Abs: 2 10*3/uL (ref 0.7–4.0)
MCH: 28.1 pg (ref 26.0–34.0)
MCHC: 32.7 g/dL (ref 30.0–36.0)
MCV: 85.7 fL (ref 78.0–100.0)
MONO ABS: 0.5 10*3/uL (ref 0.1–1.0)
Monocytes Relative: 7 % (ref 3–12)
NEUTROS ABS: 5 10*3/uL (ref 1.7–7.7)
NEUTROS PCT: 64 % (ref 43–77)
PLATELETS: 348 10*3/uL (ref 150–400)
RBC: 3.85 MIL/uL — ABNORMAL LOW (ref 3.87–5.11)
RDW: 14.9 % (ref 11.5–15.5)
WBC: 7.8 10*3/uL (ref 4.0–10.5)

## 2013-10-03 MED ORDER — LOSARTAN POTASSIUM 100 MG PO TABS: 100.0000 mg | ORAL_TABLET | Freq: Every day | ORAL | Status: DC

## 2013-10-03 MED ORDER — AMPHETAMINE-DEXTROAMPHETAMINE 20 MG PO TABS: 20.0000 mg | ORAL_TABLET | Freq: Two times a day (BID) | ORAL | Status: DC

## 2013-10-03 MED ORDER — SERTRALINE HCL 50 MG PO TABS: 50.0000 mg | ORAL_TABLET | Freq: Every day | ORAL | Status: DC

## 2013-10-03 NOTE — Progress Notes (Signed)
Subjective:    Patient ID: Kathleen Weaver, female    DOB: March 03, 1976, 38 y.o.   MRN: [id number]  HPI Comments: 38 yo WF presents for F/U for HTN, Cholesterol, Pre-Dm, D. Deficient. She has been followed by OBGYN over last year while pregnant.  She has a 48 month old daughter. She had gestational DM but has resolved. She notes BP was ok during pregnancy but it has started going up since pregnancy, she had d/c Losartan during pregnancy. She has had post-partum but it has improved. She has had post-partum with all pregnancy's in past. She will be out of work x 10 weeks with pregnancy leave. She has started Lexapro 20 mg and feels it makes her dizzy.  She wants to retry Zoloft and stop Lexapro. She would also like to restart Adderall before returning to work and she is not breast feeding. She also wants to restart Losartan since it controlled her BP in past. She denies any other CV complaints.  She had low HGB in hospital with delivery and has not had recheck. She had emergency caesarean and lost a large amount of blood but did not have to get transfusion. She notes fatigue + but also is awake Q3 hours for feedings.   Gastrophageal Reflux Associated symptoms include fatigue.     Medication List       This list is accurate as of: 10/03/13  3:51 PM.  Always use your most recent med list.               albuterol 108 (90 BASE) MCG/ACT inhaler  Commonly known as:  PROVENTIL HFA;VENTOLIN HFA  Inhale 1 puff into the lungs every 6 (six) hours as needed for wheezing or shortness of breath.     Cetirizine HCl 10 MG Caps  Commonly known as:  ZYRTEC ALLERGY  Take 1 capsule (10 mg total) by mouth daily.     escitalopram 10 MG tablet  Commonly known as:  LEXAPRO  Take 10 mg by mouth daily.     hydrocortisone 25 MG suppository  Commonly known as:  ANUSOL-HC  Place 25 mg rectally 2 (two) times daily as needed for hemorrhoids or itching.     ibuprofen 600 MG tablet  Commonly known as:   ADVIL,MOTRIN  Take 1 tablet (600 mg total) by mouth every 6 (six) hours.     nystatin-triamcinolone ointment  Commonly known as:  MYCOLOG  Apply 1 application topically daily as needed (for itching).     omeprazole 20 MG capsule  Commonly known as:  PRILOSEC  Take 20 mg by mouth every other day.     prenatal multivitamin Tabs tablet  Take 1 tablet by mouth at bedtime.       Allergies  Allergen Reactions  . Lisinopril Shortness Of Breath and Cough  . Latex Rash   Past Medical History  Diagnosis Date  . Short cervix affecting pregnancy 04/20/2013  . Cervical cerclage suture present 04/21/2013  . Fibroid   . Abnormal Pap smear     bx x2; LEEP  . Mental disorder     mild bipolar  . Hypertension     no meds   . Gestational diabetes mellitus, antepartum     GDM  . Anxiety     no meds  . Asthma   . GERD (gastroesophageal reflux disease)   . ADD (attention deficit disorder)   . Hyperlipidemia   . PCOS (polycystic ovarian syndrome)        Review  of Systems  Constitutional: Positive for fatigue.  Psychiatric/Behavioral: Positive for confusion and sleep disturbance. Negative for suicidal ideas. The patient is nervous/anxious.   All other systems reviewed and are negative.  BP 160/84  Pulse 96  Temp(Src) 98.6 F (37 C) (Temporal)  Resp 18  Ht 5' 8.25" (1.734 m)  Wt 275 lb (124.739 kg)  BMI 41.49 kg/m2     Objective:   Physical Exam  Nursing note and vitals reviewed. Constitutional: She is oriented to person, place, and time. She appears well-developed and well-nourished. No distress.  overweight  HENT:  Head: Normocephalic and atraumatic.  Right Ear: External ear normal.  Left Ear: External ear normal.  Nose: Nose normal.  Mouth/Throat: Oropharynx is clear and moist.  Eyes: Conjunctivae and EOM are normal.  Neck: Normal range of motion. Neck supple. No JVD present. No thyromegaly present.  Cardiovascular: Normal rate, regular rhythm, normal heart sounds  and intact distal pulses.   Pulmonary/Chest: Effort normal and breath sounds normal.  Abdominal: Soft. Bowel sounds are normal. She exhibits no distension and no mass. There is no tenderness. There is no rebound and no guarding.  Musculoskeletal: Normal range of motion. She exhibits no edema and no tenderness.  Lymphadenopathy:    She has no cervical adenopathy.  Neurological: She is alert and oriented to person, place, and time. No cranial nerve deficit.  Skin: Skin is warm and dry. No rash noted. No erythema. No pallor.  Psychiatric: She has a normal mood and affect. Her behavior is normal. Judgment and thought content normal.          Assessment & Plan:  1.   F/U for HTN, Cholesterol, Pre-Dm, D. Deficient. Needs healthy diet, cardio QD and obtain healthy weight. Check Labs, Check BP if >130/80 call office. Restart Losartan 100 mg QD.  2. Postpartum vs depression/ anxiety HX-D/C Lexapro 20 mg by1/2 Lexapro Tues and WED. No Lexapro Thur, 1/2 Friday, None Sat, 1/2 SUnday then stop. Start Zoloft on Tuesday. Call with results, w/c if SX increase or ER.   3. ADD- refill Adderall AD but DO Not Start ADDerall until BP is less than 130/ 80

## 2013-10-03 NOTE — Patient Instructions (Addendum)
Depression, Adult  1/2 Lexapro Tues and WED. No Lexapro Thur, 1/2 Friday, None Sat, 1/2 SUnday then stop. Start Zoloft on TUESDAY Depression is feeling sad, low, down in the dumps, blue, gloomy, or empty. In general, there are two kinds of depression:  Normal sadness or grief. This can happen after something upsetting. It often goes away on its own within 2 weeks. After losing a loved one (bereavement), normal sadness and grief may last longer than two weeks. It usually gets better with time.  Clinical depression. This kind lasts longer than normal sadness or grief. It keeps you from doing the things you normally do in life. It is often hard to function at home, work, or at school. It may affect your relationships with others. Treatment is often needed. GET HELP RIGHT AWAY IF:  You have thoughts about hurting yourself or others.  You lose touch with reality (psychotic symptoms). You may:  See or hear things that are not real.  Have untrue beliefs about your life or people around you.  Your medicine is giving you problems. MAKE SURE YOU:  Understand these instructions.  Will watch your condition.  Will get help right away if you are not doing well or get worse. Document Released: 05/30/2010 Document Revised: 01/20/2012 Document Reviewed: 08/27/2011 [facility] Patient Information 2014 [place], [place]. Hypertension Hypertension is another name for high blood pressure. High blood pressure may mean that your heart needs to work harder to pump blood. Blood pressure consists of two numbers, which includes a higher number over a lower number (example: 110/72). HOME CARE  Make lifestyle changes as told by your doctor. This may include weight loss and exercise. Take your blood pressure medicine every day. Limit how much salt you use. Stop smoking if you smoke. Do not use drugs. Talk to your doctor if you are using decongestants or birth control pills. These medicines might make blood pressure  higher. Females should not drink more than 1 alcoholic drink per day. Males should not drink more than 2 alcoholic drinks per day. See your doctor as told. GET HELP RIGHT AWAY IF:  You have a blood pressure reading with a top number of 180 or higher. You get a very bad headache. You get blurred or changing vision. You feel confused. You feel weak, numb, or faint. You get chest or belly (abdominal) pain. You throw up (vomit). You cannot breathe very well. MAKE SURE YOU:  Understand these instructions. Will watch your condition. Will get help right away if you are not doing well or get worse. Document Released: 10/14/2007 Document Revised: 07/20/2011 Document Reviewed: 10/14/2007 [facility] Patient Information 2014 [place], [place].

## 2013-10-04 LAB — HEMOGLOBIN A1C
Hgb A1c MFr Bld: 5.7 % — ABNORMAL HIGH (ref <5.7)
MEAN PLASMA GLUCOSE: 117 mg/dL — AB (ref <117)

## 2013-10-04 LAB — HEPATIC FUNCTION PANEL
ALT: 62 U/L — AB (ref 0–35)
AST: 55 U/L — AB (ref 0–37)
Albumin: 3.4 g/dL — ABNORMAL LOW (ref 3.5–5.2)
Alkaline Phosphatase: 86 U/L (ref 39–117)
Total Bilirubin: 0.2 mg/dL (ref 0.2–1.2)
Total Protein: 6.3 g/dL (ref 6.0–8.3)

## 2013-10-04 LAB — LIPID PANEL
Cholesterol: 186 mg/dL (ref 0–200)
HDL: 48 mg/dL (ref >39)
LDL CALC: 88 mg/dL (ref 0–99)
Total CHOL/HDL Ratio: 3.9 Ratio
Triglycerides: 249 mg/dL — ABNORMAL HIGH (ref <150)
VLDL: 50 mg/dL — ABNORMAL HIGH (ref 0–40)

## 2013-10-04 LAB — IRON AND TIBC
%SAT: 9 % — ABNORMAL LOW (ref 20–55)
IRON: 38 ug/dL — AB (ref 42–145)
TIBC: 432 ug/dL (ref 250–470)
UIBC: 394 ug/dL (ref 125–400)

## 2013-10-04 LAB — BASIC METABOLIC PANEL WITH GFR
BUN: 10 mg/dL (ref 6–23)
CHLORIDE: 101 meq/L (ref 96–112)
CO2: 27 mEq/L (ref 19–32)
Calcium: 8.6 mg/dL (ref 8.4–10.5)
Creat: 0.63 mg/dL (ref 0.50–1.10)
GFR, Est African American: >89 mL/min
Glucose, Bld: 71 mg/dL (ref 70–99)
POTASSIUM: 4.4 meq/L (ref 3.5–5.3)
Sodium: 140 mEq/L (ref 135–145)

## 2013-10-04 LAB — VITAMIN B12: VITAMIN B 12: 343 pg/mL (ref 211–911)

## 2013-10-04 LAB — TSH: TSH: 3.676 u[IU]/mL (ref 0.350–4.500)

## 2013-10-04 LAB — INSULIN, FASTING: INSULIN FASTING, SERUM: 22 u[IU]/mL (ref 3–28)

## 2013-10-19 ENCOUNTER — Other Ambulatory Visit: Payer: Self-pay

## 2013-10-19 ENCOUNTER — Other Ambulatory Visit: Payer: Self-pay | Admitting: Emergency Medicine

## 2013-10-26 ENCOUNTER — Other Ambulatory Visit: Payer: Self-pay | Admitting: Emergency Medicine

## 2013-10-26 DIAGNOSIS — R6889 Other general symptoms and signs: Secondary | ICD-10-CM

## 2013-10-30 ENCOUNTER — Encounter (INDEPENDENT_AMBULATORY_CARE_PROVIDER_SITE_OTHER): Payer: Self-pay

## 2013-10-30 ENCOUNTER — Other Ambulatory Visit: Payer: Self-pay | Admitting: Emergency Medicine

## 2013-10-30 ENCOUNTER — Other Ambulatory Visit (INDEPENDENT_AMBULATORY_CARE_PROVIDER_SITE_OTHER): Payer: PRIVATE HEALTH INSURANCE

## 2013-10-30 DIAGNOSIS — R6889 Other general symptoms and signs: Secondary | ICD-10-CM

## 2013-10-30 LAB — HEPATIC FUNCTION PANEL
ALT: 38 U/L — ABNORMAL HIGH (ref 0–35)
AST: 26 U/L (ref 0–37)
Albumin: 3.7 g/dL (ref 3.5–5.2)
Alkaline Phosphatase: 85 U/L (ref 39–117)
BILIRUBIN INDIRECT: 0.3 mg/dL (ref 0.2–1.2)
Bilirubin, Direct: 0.1 mg/dL (ref 0.0–0.3)
Total Bilirubin: 0.4 mg/dL (ref 0.2–1.2)
Total Protein: 6.6 g/dL (ref 6.0–8.3)

## 2013-10-30 MED ORDER — AMPHETAMINE-DEXTROAMPHETAMINE 20 MG PO TABS: 20.0000 mg | ORAL_TABLET | Freq: Two times a day (BID) | ORAL | Status: DC

## 2013-11-28 ENCOUNTER — Other Ambulatory Visit: Payer: Self-pay | Admitting: Internal Medicine

## 2013-11-28 MED ORDER — AMPHETAMINE-DEXTROAMPHETAMINE 20 MG PO TABS: 20.0000 mg | ORAL_TABLET | Freq: Two times a day (BID) | ORAL | Status: DC

## 2013-11-29 ENCOUNTER — Other Ambulatory Visit: Payer: Self-pay

## 2013-11-29 MED ORDER — ALBUTEROL SULFATE HFA 108 (90 BASE) MCG/ACT IN AERS: 1.0000 | INHALATION_SPRAY | Freq: Four times a day (QID) | RESPIRATORY_TRACT | Status: DC | PRN

## 2014-01-01 ENCOUNTER — Other Ambulatory Visit: Payer: Self-pay | Admitting: Internal Medicine

## 2014-01-01 DIAGNOSIS — F988 Other specified behavioral and emotional disorders with onset usually occurring in childhood and adolescence: Secondary | ICD-10-CM

## 2014-01-01 MED ORDER — AMPHETAMINE-DEXTROAMPHETAMINE 20 MG PO TABS: ORAL_TABLET | ORAL | Status: DC

## 2014-02-01 ENCOUNTER — Other Ambulatory Visit: Payer: Self-pay | Admitting: Emergency Medicine

## 2014-02-01 ENCOUNTER — Telehealth: Payer: Self-pay

## 2014-02-01 DIAGNOSIS — F988 Other specified behavioral and emotional disorders with onset usually occurring in childhood and adolescence: Secondary | ICD-10-CM

## 2014-02-01 MED ORDER — AMPHETAMINE-DEXTROAMPHETAMINE 20 MG PO TABS
ORAL_TABLET | ORAL | Status: DC
Start: 2014-02-01 — End: 2014-03-02

## 2014-02-01 MED ORDER — OMEPRAZOLE 20 MG PO CPDR: 20.0000 mg | DELAYED_RELEASE_CAPSULE | Freq: Every day | ORAL | Status: DC

## 2014-02-01 NOTE — Telephone Encounter (Signed)
Adderall Rx ready for pick up. Lmom to pt.

## 2014-02-02 ENCOUNTER — Other Ambulatory Visit: Payer: Self-pay | Admitting: Emergency Medicine

## 2014-02-08 ENCOUNTER — Encounter: Payer: Self-pay | Admitting: Emergency Medicine

## 2014-02-20 ENCOUNTER — Encounter: Payer: Self-pay | Admitting: Emergency Medicine

## 2014-03-02 ENCOUNTER — Other Ambulatory Visit: Payer: Self-pay | Admitting: Physician Assistant

## 2014-03-02 DIAGNOSIS — F988 Other specified behavioral and emotional disorders with onset usually occurring in childhood and adolescence: Secondary | ICD-10-CM

## 2014-03-02 MED ORDER — AMPHETAMINE-DEXTROAMPHETAMINE 20 MG PO TABS: ORAL_TABLET | ORAL | Status: DC

## 2014-03-12 ENCOUNTER — Encounter: Payer: Self-pay | Admitting: Emergency Medicine

## 2014-03-15 ENCOUNTER — Encounter: Payer: Self-pay | Admitting: Emergency Medicine

## 2014-03-15 ENCOUNTER — Ambulatory Visit (INDEPENDENT_AMBULATORY_CARE_PROVIDER_SITE_OTHER): Payer: PRIVATE HEALTH INSURANCE | Admitting: Emergency Medicine

## 2014-03-15 VITALS — BP 132/78 | HR 76 | Temp 98.2°F | Resp 18 | Ht 68.5 in | Wt 282.0 lb

## 2014-03-15 DIAGNOSIS — Z23 Encounter for immunization: Secondary | ICD-10-CM

## 2014-03-15 DIAGNOSIS — F988 Other specified behavioral and emotional disorders with onset usually occurring in childhood and adolescence: Secondary | ICD-10-CM

## 2014-03-15 DIAGNOSIS — Z1212 Encounter for screening for malignant neoplasm of rectum: Secondary | ICD-10-CM

## 2014-03-15 DIAGNOSIS — E559 Vitamin D deficiency, unspecified: Secondary | ICD-10-CM

## 2014-03-15 DIAGNOSIS — R6889 Other general symptoms and signs: Secondary | ICD-10-CM

## 2014-03-15 DIAGNOSIS — Z0000 Encounter for general adult medical examination without abnormal findings: Secondary | ICD-10-CM

## 2014-03-15 DIAGNOSIS — Z79899 Other long term (current) drug therapy: Secondary | ICD-10-CM

## 2014-03-15 DIAGNOSIS — E669 Obesity, unspecified: Secondary | ICD-10-CM

## 2014-03-15 DIAGNOSIS — I159 Secondary hypertension, unspecified: Secondary | ICD-10-CM

## 2014-03-15 DIAGNOSIS — F909 Attention-deficit hyperactivity disorder, unspecified type: Secondary | ICD-10-CM

## 2014-03-15 DIAGNOSIS — Z0001 Encounter for general adult medical examination with abnormal findings: Secondary | ICD-10-CM

## 2014-03-15 LAB — CBC WITH DIFFERENTIAL/PLATELET
BASOS ABS: 0 10*3/uL (ref 0.0–0.1)
BASOS PCT: 0 % (ref 0–1)
EOS PCT: 2 % (ref 0–5)
Eosinophils Absolute: 0.2 10*3/uL (ref 0.0–0.7)
HEMATOCRIT: 32 % — AB (ref 36.0–46.0)
Hemoglobin: 10.7 g/dL — ABNORMAL LOW (ref 12.0–15.0)
Lymphocytes Relative: 26 % (ref 12–46)
Lymphs Abs: 2.4 10*3/uL (ref 0.7–4.0)
MCH: 27.3 pg (ref 26.0–34.0)
MCHC: 33.4 g/dL (ref 30.0–36.0)
MCV: 81.6 fL (ref 78.0–100.0)
MONO ABS: 0.6 10*3/uL (ref 0.1–1.0)
Monocytes Relative: 7 % (ref 3–12)
Neutro Abs: 6 10*3/uL (ref 1.7–7.7)
Neutrophils Relative %: 65 % (ref 43–77)
PLATELETS: 389 10*3/uL (ref 150–400)
RBC: 3.92 MIL/uL (ref 3.87–5.11)
RDW: 15.4 % (ref 11.5–15.5)
WBC: 9.2 10*3/uL (ref 4.0–10.5)

## 2014-03-15 MED ORDER — AMPHETAMINE-DEXTROAMPHETAMINE 20 MG PO TABS: ORAL_TABLET | ORAL | Status: DC

## 2014-03-15 NOTE — Patient Instructions (Signed)
Generalized Anxiety Disorder Generalized anxiety disorder (GAD) is a mental disorder. It interferes with life functions, including relationships, work, and school. GAD is different from normal anxiety, which everyone experiences at some point in their lives in response to specific life events and activities. Normal anxiety actually helps us prepare for and get through these life events and activities. Normal anxiety goes away after the event or activity is over.  GAD causes anxiety that is not necessarily related to specific events or activities. It also causes excess anxiety in proportion to specific events or activities. The anxiety associated with GAD is also difficult to control. GAD can vary from mild to severe. People with severe GAD can have intense waves of anxiety with physical symptoms (panic attacks).  SYMPTOMS The anxiety and worry associated with GAD are difficult to control. This anxiety and worry are related to many life events and activities and also occur more days than not for 6 months or longer. People with GAD also have three or more of the following symptoms (one or more in children):  Restlessness.   Fatigue.  Difficulty concentrating.   Irritability.  Muscle tension.  Difficulty sleeping or unsatisfying sleep. DIAGNOSIS GAD is diagnosed through an assessment by your health care provider. Your health care provider will ask you questions aboutyour mood,physical symptoms, and events in your life. Your health care provider may ask you about your medical history and use of alcohol or drugs, including prescription medicines. Your health care provider may also do a physical exam and blood tests. Certain medical conditions and the use of certain substances can cause symptoms similar to those associated with GAD. Your health care provider may refer you to a mental health specialist for further evaluation. TREATMENT The following therapies are usually used to treat GAD:    Medication. Antidepressant medication usually is prescribed for long-term daily control. Antianxiety medicines may be added in severe cases, especially when panic attacks occur.   Talk therapy (psychotherapy). Certain types of talk therapy can be helpful in treating GAD by providing support, education, and guidance. A form of talk therapy called cognitive behavioral therapy can teach you healthy ways to think about and react to daily life events and activities.  Stress managementtechniques. These include yoga, meditation, and exercise and can be very helpful when they are practiced regularly. A mental health specialist can help determine which treatment is best for you. Some people see improvement with one therapy. However, other people require a combination of therapies. Document Released: 08/22/2012 Document Revised: 09/11/2013 Document Reviewed: 08/22/2012 [employer] Patient Information 2015 [employer]. This information is not intended to replace advice given to you by your health care provider. Make sure you discuss any questions you have with your health care provider.  

## 2014-03-15 NOTE — Progress Notes (Signed)
Subjective:    Patient ID: NASIYA PASCUAL, female    DOB: 02-15-1976, 38 y.o.   MRN: [id number]  HPI Comments: 38 yo pleasant obese WF CPE and presents for 3 month F/U for HTN, Cholesterol, Pre-Dm, D. Deficient. She notes increased stress with new baby/ work/ family and has not been taking care of herself. She has not been eating healthy or exercising. She is concerned Zoloft may need to be increased or changed.  She did restart Adderall to help with focus but is concerned it may be increasing BP vs stress increasing BP. She notes Adderall helps significantly with work and household chores.   She has been followed for anemia for several months but notes very heavy cycle currently. She thinks her previous lab was also with heavy cycle.    Lab Results      Component                Value               Date                      WBC                      7.8                 10/03/2013                HGB                      10.8*               10/03/2013                HCT                      33.0*               10/03/2013                PLT                      348                 10/03/2013                GLUCOSE                  71                  10/03/2013                CHOL                     186                 10/03/2013                TRIG                     249*                10/03/2013                HDL                      48  10/03/2013                LDLCALC                  88                  10/03/2013                ALT                      38*                 10/30/2013                AST                      26                  10/30/2013                NA                       140                 10/03/2013                K                        4.4                 10/03/2013                CL                       101                 10/03/2013                CREATININE               0.63                10/03/2013                BUN                       10                  10/03/2013                CO2                      27                  10/03/2013                TSH                      3.676               10/03/2013                HGBA1C                   5.7*                10/03/2013  Current Outpatient Prescriptions on File Prior to Visit  Medication Sig Dispense Refill  . albuterol (PROVENTIL HFA;VENTOLIN HFA) 108 (90 BASE) MCG/ACT inhaler Inhale 1 puff into the lungs every 6 (six) hours as needed for wheezing or shortness of breath. 16 g 3  . Cetirizine HCl (ZYRTEC ALLERGY) 10 MG CAPS Take 1 capsule (10 mg total) by mouth daily. 90 capsule 1  . hydrocortisone (ANUSOL-HC) 25 MG suppository Place 25 mg rectally 2 (two) times daily as needed for hemorrhoids or itching.    Marland Kitchen ibuprofen (ADVIL,MOTRIN) 600 MG tablet Take 1 tablet (600 mg total) by mouth every 6 (six) hours. 30 tablet 0  . losartan (COZAAR) 100 MG tablet TAKE 1 TABLET (100 MG ) BY MOUTH DAILY. 30 tablet 3  . nystatin-triamcinolone ointment (MYCOLOG) Apply 1 application topically daily as needed (for itching).    Marland Kitchen omeprazole (PRILOSEC) 20 MG capsule Take 1 capsule (20 mg total) by mouth daily. 90 capsule 1  . sertraline (ZOLOFT) 50 MG tablet TAKE 2 TABS ONCE DAILY 60 tablet 2   No current facility-administered medications on file prior to visit.   Allergies  Allergen Reactions  . Lisinopril Shortness Of Breath and Cough  . Latex Rash   Past Medical History  Diagnosis Date  . Short cervix affecting pregnancy 04/20/2013  . Cervical cerclage suture present 04/21/2013  . Fibroid   . Abnormal Pap smear     bx x2; LEEP  . Mental disorder     mild bipolar  . Hypertension     no meds   . Gestational diabetes mellitus, antepartum     GDM  . Anxiety     no meds  . Asthma   . GERD (gastroesophageal reflux disease)   . ADD (attention deficit disorder)   . Hyperlipidemia   . PCOS (polycystic ovarian syndrome)    Past Surgical History   Procedure Laterality Date  . Cervical cerclage N/A 04/21/2013    Procedure: CERCLAGE CERVICAL;  Surgeon: Thornell Sartorius, MD;  Location: [facility];  Service: Gynecology;  Laterality: N/A;  . Cesarean section      x 2  . Tonsillectomy    . Bartholin cyst marsupialization    . Therapeutic abortion    . Wisdom tooth extraction    . Leep    . Cesarean section N/A 08/31/2013    Procedure: CESAREAN SECTION;  Surgeon: Melina Schools, MD;  Location: [facility];  Service: Obstetrics;  Laterality: N/A;  . Tubal ligation     History  Substance Use Topics  . Smoking status: Current Every Day Smoker -- 0.50 packs/day for 25 years    Types: Cigarettes  . Smokeless tobacco: Never Used  . Alcohol Use: No   Family History  Problem Relation Age of Onset  . Cancer Mother     2 cousins- w/breast  . Hypertension Mother   . Cancer Paternal Uncle   . Hypertension Maternal Grandmother   . Stroke Maternal Grandmother   . Diabetes Maternal Grandfather   . Heart disease Maternal Grandfather   . Hypertension Maternal Grandfather   . Diabetes Paternal Grandmother   . Heart disease Paternal Grandfather   . Hypertension Paternal Grandfather   . Hearing loss Neg Hx     MAINTENANCE: Mammo:will get updated Pap/ Pelvic:06/2012 abnormal repeat at gyn [id number] Dentist:  overdue  IMMUNIZATIONS: Td:2009 Influenza: 2015 Patient Care Team: Unk Pinto, MD as PCP - General (Internal Medicine) Janyth Contes, MD as Consulting Physician (Obstetrics and Gynecology) Drake Leach, OD (  Optometry)   Review of Systems  Constitutional: Positive for fatigue.  Respiratory: Negative for shortness of breath.   Cardiovascular: Negative for chest pain.  Psychiatric/Behavioral: The patient is nervous/anxious.   All other systems reviewed and are negative.  BP 132/78 mmHg  Pulse 76  Temp(Src) 98.2 F (36.8 C) (Temporal)  Resp 18  Ht 5' 8.5" (1.74 m)  Wt 282 lb (127.914 kg)  BMI 42.25 kg/m2  LMP 03/06/2014  (Within Days)     Objective:   Physical Exam  Constitutional: She is oriented to person, place, and time. She appears well-developed and well-nourished. No distress.  obese  HENT:  Head: Normocephalic and atraumatic.  Right Ear: External ear normal.  Left Ear: External ear normal.  Nose: Nose normal.  Mouth/Throat: Oropharynx is clear and moist.  Eyes: Conjunctivae and EOM are normal. Pupils are equal, round, and reactive to light. Right eye exhibits no discharge. Left eye exhibits no discharge. No scleral icterus.  Neck: Normal range of motion. Neck supple. No JVD present. No tracheal deviation present. No thyromegaly present.  Cardiovascular: Normal rate, regular rhythm, normal heart sounds and intact distal pulses.   Pulmonary/Chest: Effort normal and breath sounds normal.  Abdominal: Soft. Bowel sounds are normal. She exhibits no distension and no mass. There is no tenderness. There is no rebound and no guarding.  Genitourinary:  Def gyn  Musculoskeletal: Normal range of motion. She exhibits no edema or tenderness.  Lymphadenopathy:    She has no cervical adenopathy.  Neurological: She is alert and oriented to person, place, and time. She has normal reflexes. No cranial nerve deficit. She exhibits normal muscle tone. Coordination normal.  Skin: Skin is warm and dry. No rash noted. No erythema. No pallor.  Exposed area  Psychiatric: She has a normal mood and affect. Her behavior is normal. Judgment and thought content normal.  Nursing note and vitals reviewed.     EKG NSCSPT WNL    Assessment & Plan:  1. CPE- Update screening labs/ History/ Immunizations/ Testing as needed. Advised healthy diet, QD exercise, increase H20 and continue RX/ Vitamins AD.  2. 3 month F/U for Obesity, HTN, Cholesterol, Pre-Dm, D. Deficient. Needs healthy diet, cardio QD and obtain healthy weight. Check Labs, Check BP if >130/80 call office  3. ADD- Controlled refill RX AD. Monitor BP if stays  elevated please call for RX Change  4. Anxiety/ Depression- Check labs if NEG consider RX change, increase exercise/activity for now continue RX AD w/c if SX increase or ER, recommend counseling if symptoms continue   5. Anemia- Recheck labs, take Vitamins AD increase green leafy veggies, increase H2O, w/c if any symptom increase. May need recheck 2 weeks after cycle.

## 2014-03-16 LAB — VITAMIN D 25 HYDROXY (VIT D DEFICIENCY, FRACTURES): Vit D, 25-Hydroxy: 36 ng/mL (ref 30–89)

## 2014-03-16 LAB — MICROALBUMIN / CREATININE URINE RATIO
Creatinine, Urine: 516.4 mg/dL
Microalb Creat Ratio: 11.6 mg/g (ref 0.0–30.0)
Microalb, Ur: 6 mg/dL — ABNORMAL HIGH (ref <2.0)

## 2014-03-16 LAB — HEPATIC FUNCTION PANEL
ALT: 15 U/L (ref 0–35)
AST: 18 U/L (ref 0–37)
Albumin: 3.6 g/dL (ref 3.5–5.2)
Alkaline Phosphatase: 75 U/L (ref 39–117)
Total Bilirubin: 0.3 mg/dL (ref 0.2–1.2)
Total Protein: 6.8 g/dL (ref 6.0–8.3)

## 2014-03-16 LAB — TSH: TSH: 2.831 u[IU]/mL (ref 0.350–4.500)

## 2014-03-16 LAB — URINALYSIS, ROUTINE W REFLEX MICROSCOPIC
GLUCOSE, UA: NEGATIVE mg/dL
HGB URINE DIPSTICK: NEGATIVE
Leukocytes, UA: NEGATIVE
Nitrite: NEGATIVE
PROTEIN: NEGATIVE mg/dL
Specific Gravity, Urine: >1.03 — ABNORMAL HIGH (ref 1.005–1.030)
UROBILINOGEN UA: 1 mg/dL (ref 0.0–1.0)
pH: 5.5 (ref 5.0–8.0)

## 2014-03-16 LAB — BASIC METABOLIC PANEL WITH GFR
BUN: 11 mg/dL (ref 6–23)
CHLORIDE: 105 meq/L (ref 96–112)
CO2: 28 mEq/L (ref 19–32)
Calcium: 9.1 mg/dL (ref 8.4–10.5)
Creat: 0.7 mg/dL (ref 0.50–1.10)
Glucose, Bld: 82 mg/dL (ref 70–99)
Potassium: 4.1 mEq/L (ref 3.5–5.3)
Sodium: 140 mEq/L (ref 135–145)

## 2014-03-16 LAB — IRON AND TIBC
%SAT: 8 % — ABNORMAL LOW (ref 20–55)
Iron: 37 ug/dL — ABNORMAL LOW (ref 42–145)
TIBC: 439 ug/dL (ref 250–470)
UIBC: 402 ug/dL — AB (ref 125–400)

## 2014-03-16 LAB — VITAMIN B12: VITAMIN B 12: 390 pg/mL (ref 211–911)

## 2014-03-16 LAB — LIPID PANEL
CHOL/HDL RATIO: 3.4 ratio
CHOLESTEROL: 156 mg/dL (ref 0–200)
HDL: 46 mg/dL (ref >39)
LDL CALC: 91 mg/dL (ref 0–99)
Triglycerides: 95 mg/dL (ref <150)
VLDL: 19 mg/dL (ref 0–40)

## 2014-03-16 LAB — MAGNESIUM: Magnesium: 1.6 mg/dL (ref 1.5–2.5)

## 2014-03-16 LAB — HEMOGLOBIN A1C
Hgb A1c MFr Bld: 5.9 % — ABNORMAL HIGH (ref <5.7)
Mean Plasma Glucose: 123 mg/dL — ABNORMAL HIGH (ref <117)

## 2014-03-16 LAB — INSULIN, FASTING: INSULIN FASTING, SERUM: 17.4 u[IU]/mL (ref 2.0–19.6)

## 2014-03-21 ENCOUNTER — Encounter: Payer: Self-pay | Admitting: Emergency Medicine

## 2014-04-18 ENCOUNTER — Other Ambulatory Visit (INDEPENDENT_AMBULATORY_CARE_PROVIDER_SITE_OTHER): Payer: PRIVATE HEALTH INSURANCE

## 2014-04-18 DIAGNOSIS — Z0000 Encounter for general adult medical examination without abnormal findings: Secondary | ICD-10-CM

## 2014-04-18 DIAGNOSIS — Z1212 Encounter for screening for malignant neoplasm of rectum: Secondary | ICD-10-CM

## 2014-04-18 LAB — POC HEMOCCULT BLD/STL (HOME/3-CARD/SCREEN)
Card #2 Fecal Occult Blod, POC: NEGATIVE
Card #3 Fecal Occult Blood, POC: NEGATIVE
FECAL OCCULT BLD: NEGATIVE

## 2014-04-20 DIAGNOSIS — G5600 Carpal tunnel syndrome, unspecified upper limb: Secondary | ICD-10-CM | POA: Insufficient documentation

## 2014-04-20 DIAGNOSIS — G5603 Carpal tunnel syndrome, bilateral upper limbs: Secondary | ICD-10-CM | POA: Insufficient documentation

## 2014-04-27 ENCOUNTER — Encounter: Payer: Self-pay | Admitting: Emergency Medicine

## 2014-04-27 ENCOUNTER — Other Ambulatory Visit: Payer: Self-pay | Admitting: Emergency Medicine

## 2014-04-27 ENCOUNTER — Other Ambulatory Visit: Payer: Self-pay | Admitting: Internal Medicine

## 2014-04-27 DIAGNOSIS — F988 Other specified behavioral and emotional disorders with onset usually occurring in childhood and adolescence: Secondary | ICD-10-CM

## 2014-04-27 DIAGNOSIS — I10 Essential (primary) hypertension: Secondary | ICD-10-CM

## 2014-04-27 MED ORDER — BISOPROLOL-HYDROCHLOROTHIAZIDE 5-6.25 MG PO TABS: 1.0000 | ORAL_TABLET | Freq: Every day | ORAL | Status: DC

## 2014-04-27 MED ORDER — AMPHETAMINE-DEXTROAMPHETAMINE 20 MG PO TABS: ORAL_TABLET | ORAL | Status: DC

## 2014-05-24 ENCOUNTER — Encounter (HOSPITAL_COMMUNITY): Payer: Self-pay | Admitting: Obstetrics and Gynecology

## 2014-05-28 ENCOUNTER — Other Ambulatory Visit: Payer: Self-pay | Admitting: Physician Assistant

## 2014-05-28 DIAGNOSIS — F988 Other specified behavioral and emotional disorders with onset usually occurring in childhood and adolescence: Secondary | ICD-10-CM

## 2014-05-28 MED ORDER — AMPHETAMINE-DEXTROAMPHETAMINE 20 MG PO TABS: ORAL_TABLET | ORAL | Status: DC

## 2014-06-22 ENCOUNTER — Ambulatory Visit: Payer: Self-pay | Admitting: Emergency Medicine

## 2014-06-27 ENCOUNTER — Other Ambulatory Visit: Payer: Self-pay | Admitting: Internal Medicine

## 2014-06-27 DIAGNOSIS — F988 Other specified behavioral and emotional disorders with onset usually occurring in childhood and adolescence: Secondary | ICD-10-CM

## 2014-06-27 MED ORDER — AMPHETAMINE-DEXTROAMPHETAMINE 20 MG PO TABS: ORAL_TABLET | ORAL | Status: DC

## 2014-07-03 ENCOUNTER — Ambulatory Visit (INDEPENDENT_AMBULATORY_CARE_PROVIDER_SITE_OTHER): Payer: PRIVATE HEALTH INSURANCE | Admitting: Emergency Medicine

## 2014-07-03 ENCOUNTER — Encounter: Payer: Self-pay | Admitting: Emergency Medicine

## 2014-07-03 VITALS — BP 162/76 | HR 88 | Temp 98.2°F | Resp 16 | Ht 68.5 in | Wt 293.0 lb

## 2014-07-03 DIAGNOSIS — R350 Frequency of micturition: Secondary | ICD-10-CM

## 2014-07-03 DIAGNOSIS — D649 Anemia, unspecified: Secondary | ICD-10-CM

## 2014-07-03 DIAGNOSIS — I10 Essential (primary) hypertension: Secondary | ICD-10-CM

## 2014-07-03 DIAGNOSIS — R739 Hyperglycemia, unspecified: Secondary | ICD-10-CM

## 2014-07-03 DIAGNOSIS — R809 Proteinuria, unspecified: Secondary | ICD-10-CM

## 2014-07-03 LAB — CBC WITH DIFFERENTIAL/PLATELET
BASOS PCT: 0 % (ref 0–1)
Basophils Absolute: 0 10*3/uL (ref 0.0–0.1)
Eosinophils Absolute: 0.1 10*3/uL (ref 0.0–0.7)
Eosinophils Relative: 1 % (ref 0–5)
HCT: 33.8 % — ABNORMAL LOW (ref 36.0–46.0)
Hemoglobin: 11.2 g/dL — ABNORMAL LOW (ref 12.0–15.0)
Lymphocytes Relative: 24 % (ref 12–46)
Lymphs Abs: 2.2 10*3/uL (ref 0.7–4.0)
MCH: 28.1 pg (ref 26.0–34.0)
MCHC: 33.1 g/dL (ref 30.0–36.0)
MCV: 84.9 fL (ref 78.0–100.0)
MONO ABS: 0.6 10*3/uL (ref 0.1–1.0)
MPV: 8.9 fL (ref 8.6–12.4)
Monocytes Relative: 6 % (ref 3–12)
NEUTROS ABS: 6.3 10*3/uL (ref 1.7–7.7)
Neutrophils Relative %: 69 % (ref 43–77)
Platelets: 374 10*3/uL (ref 150–400)
RBC: 3.98 MIL/uL (ref 3.87–5.11)
RDW: 15.2 % (ref 11.5–15.5)
WBC: 9.2 10*3/uL (ref 4.0–10.5)

## 2014-07-03 LAB — HEMOGLOBIN A1C
Hgb A1c MFr Bld: 6 % — ABNORMAL HIGH (ref <5.7)
Mean Plasma Glucose: 126 mg/dL — ABNORMAL HIGH (ref <117)

## 2014-07-03 MED ORDER — LOSARTAN POTASSIUM-HCTZ 100-25 MG PO TABS: 1.0000 | ORAL_TABLET | Freq: Every day | ORAL | Status: DC

## 2014-07-03 NOTE — Patient Instructions (Signed)

## 2014-07-03 NOTE — Progress Notes (Signed)
Subjective:    Patient ID: Kathleen Weaver, female    DOB: 04-Apr-1976, 39 y.o.   MRN: [id number]  HPI Comments: 39 yo WF presents for 3 month F/U for HTN, Pre-Dm. She notes BP has been up at home and is not exercising. She has been having more HA over the past few weeks. She is not eating healthy. She was unable to tolerate [facility] due to chest tightness that improved when d/c. She notes Adderall is working for ADD but knows it elevates BP a little. She has slacked off vitamins over last few weeks. She notes last cycle about 10 days ago and was heavy as normal. She is due for OB recheck with fibroid history.  She has had increased sinus pressure over last few weeks. She has been using OTC sinus without relief.    She has noticed increased urine frequency since starting Adderall.   Lab Results      Component                Value               Date                      WBC                      9.2                 03/15/2014                HGB                      10.7*               03/15/2014                HCT                      32.0*               03/15/2014                PLT                      389                 03/15/2014                GLUCOSE                  82                  03/15/2014                CHOL                     156                 03/15/2014                TRIG                     95                  03/15/2014                HDL  46                  03/15/2014                LDLCALC                  91                  03/15/2014                ALT                      15                  03/15/2014                AST                      18                  03/15/2014                NA                       140                 03/15/2014                K                        4.1                 03/15/2014                CL                       105                 03/15/2014                CREATININE               0.70                 03/15/2014                BUN                      11                  03/15/2014                CO2                      28                  03/15/2014                TSH                      2.831               03/15/2014                HGBA1C                   5.9*  03/15/2014                MICROALBUR               6.0*                03/15/2014             Hypertension  Gastrophageal Reflux      Medication List       This list is accurate as of: 07/03/14  2:53 PM.  Always use your most recent med list.               albuterol 108 (90 BASE) MCG/ACT inhaler  Commonly known as:  PROVENTIL HFA;VENTOLIN HFA  Inhale 1 puff into the lungs every 6 (six) hours as needed for wheezing or shortness of breath.     amphetamine-dextroamphetamine 20 MG tablet  Commonly known as:  ADDERALL  Take 1/2 to 1 tablet 2 x day if needed for ADD     Cetirizine HCl 10 MG Caps  Commonly known as:  ZYRTEC ALLERGY  Take 1 capsule (10 mg total) by mouth daily.     hydrocortisone 25 MG suppository  Commonly known as:  ANUSOL-HC  Place 25 mg rectally 2 (two) times daily as needed for hemorrhoids or itching.     ibuprofen 600 MG tablet  Commonly known as:  ADVIL,MOTRIN  Take 1 tablet (600 mg total) by mouth every 6 (six) hours.     losartan 100 MG tablet  Commonly known as:  COZAAR  TAKE 1 TABLET (100 MG ) BY MOUTH DAILY.     nystatin-triamcinolone ointment  Commonly known as:  MYCOLOG  Apply 1 application topically daily as needed (for itching).     omeprazole 20 MG capsule  Commonly known as:  PRILOSEC  Take 1 capsule (20 mg total) by mouth daily.     sertraline 50 MG tablet  Commonly known as:  ZOLOFT  TAKE 2 TABS ONCE DAILY       Allergies  Allergen Reactions  . Lisinopril Shortness Of Breath and Cough  . Bisoprolol     Chest tightness   . Latex Rash   Past Medical History  Diagnosis Date  . Short cervix affecting pregnancy 04/20/2013  . Cervical cerclage  suture present 04/21/2013  . Fibroid   . Abnormal Pap smear     bx x2; LEEP  . Mental disorder     mild bipolar  . Hypertension     no meds   . Gestational diabetes mellitus, antepartum     GDM  . Anxiety     no meds  . Asthma   . GERD (gastroesophageal reflux disease)   . ADD (attention deficit disorder)   . Hyperlipidemia   . PCOS (polycystic ovarian syndrome)      Review of Systems  HENT: Positive for congestion and sinus pressure.   Genitourinary: Positive for frequency.  All other systems reviewed and are negative.  BP 162/76 mmHg  Pulse 88  Temp(Src) 98.2 F (36.8 C)  Resp 16  Ht 5' 8.5" (1.74 m)  Wt 293 lb (132.904 kg)  BMI 43.90 kg/m2     Objective:   Physical Exam  Constitutional: She appears well-developed and well-nourished. No distress.  HENT:  Nose: Nose normal.  Mouth/Throat: Oropharynx is clear and moist.  Cloudy TM's bilaterally   Eyes: Conjunctivae and EOM are normal.  Neck: Normal range of motion. Neck supple. No JVD present. No thyromegaly present.  Cardiovascular: Normal rate, regular rhythm, normal heart sounds and intact distal pulses.   1+ bilateral LE edema  Pulmonary/Chest: Effort normal and breath sounds normal.  Abdominal: Soft. Bowel sounds are normal. There is no tenderness. There is no rebound.  Musculoskeletal: Normal range of motion. She exhibits no edema or tenderness.  Lymphadenopathy:    She has no cervical adenopathy.  Neurological: She is alert. No cranial nerve deficit.  Skin: Skin is warm and dry. No rash noted.  Psychiatric: She has a normal mood and affect. Her behavior is normal. Judgment and thought content normal.  Nursing note and vitals reviewed.         Assessment & Plan:    1.  3 month F/U for HTN, Pre-Dm, D. Needs healthy diet, cardio QD and obtain healthy weight. Check Labs, Check BP if >130/80 call office. Change Losartan to Hyzaar, do not start until labs completed. Discussed may need to d/c Adderall  with elevated BP  2. Abnormal Microalbumin- recheck labs, decrease sodium diet, increase water intake.  3. Allergic rhinitis- Allegra OTC, increase H2o, allergy hygiene explained.   4. Urine frequency- Check labs

## 2014-07-04 LAB — URINALYSIS, ROUTINE W REFLEX MICROSCOPIC
BILIRUBIN URINE: NEGATIVE
Glucose, UA: NEGATIVE mg/dL
Hgb urine dipstick: NEGATIVE
KETONES UR: NEGATIVE mg/dL
Leukocytes, UA: NEGATIVE
NITRITE: NEGATIVE
Protein, ur: NEGATIVE mg/dL
Specific Gravity, Urine: 1.012 (ref 1.005–1.030)
Urobilinogen, UA: 0.2 mg/dL (ref 0.0–1.0)
pH: 6 (ref 5.0–8.0)

## 2014-07-04 LAB — BASIC METABOLIC PANEL WITH GFR
BUN: 9 mg/dL (ref 6–23)
CALCIUM: 8.9 mg/dL (ref 8.4–10.5)
CO2: 29 mEq/L (ref 19–32)
CREATININE: 0.62 mg/dL (ref 0.50–1.10)
Chloride: 100 mEq/L (ref 96–112)
GFR, Est African American: >89 mL/min
GFR, Est Non African American: >89 mL/min
Glucose, Bld: 116 mg/dL — ABNORMAL HIGH (ref 70–99)
Potassium: 4.4 mEq/L (ref 3.5–5.3)
SODIUM: 136 meq/L (ref 135–145)

## 2014-07-04 LAB — MICROALBUMIN / CREATININE URINE RATIO
Creatinine, Urine: 78.4 mg/dL
MICROALB UR: 1.4 mg/dL (ref <2.0)
Microalb Creat Ratio: 17.9 mg/g (ref 0.0–30.0)

## 2014-07-04 LAB — IRON AND TIBC
%SAT: 10 % — AB (ref 20–55)
Iron: 41 ug/dL — ABNORMAL LOW (ref 42–145)
TIBC: 403 ug/dL (ref 250–470)
UIBC: 362 ug/dL (ref 125–400)

## 2014-07-04 LAB — URINE CULTURE
COLONY COUNT: NO GROWTH
Organism ID, Bacteria: NO GROWTH

## 2014-07-04 LAB — INSULIN, FASTING: INSULIN FASTING, SERUM: 46.4 u[IU]/mL — AB (ref 2.0–19.6)

## 2014-07-05 ENCOUNTER — Other Ambulatory Visit: Payer: Self-pay | Admitting: Emergency Medicine

## 2014-07-05 MED ORDER — METFORMIN HCL ER 500 MG PO TB24: 500.0000 mg | ORAL_TABLET | Freq: Every day | ORAL | Status: DC

## 2014-07-09 ENCOUNTER — Other Ambulatory Visit: Payer: Self-pay | Admitting: Internal Medicine

## 2014-07-25 ENCOUNTER — Encounter: Payer: Self-pay | Admitting: Physician Assistant

## 2014-07-25 ENCOUNTER — Other Ambulatory Visit: Payer: Self-pay | Admitting: Physician Assistant

## 2014-07-25 DIAGNOSIS — F988 Other specified behavioral and emotional disorders with onset usually occurring in childhood and adolescence: Secondary | ICD-10-CM

## 2014-07-25 MED ORDER — AMPHETAMINE-DEXTROAMPHETAMINE 20 MG PO TABS: ORAL_TABLET | ORAL | Status: DC

## 2014-08-08 ENCOUNTER — Ambulatory Visit (INDEPENDENT_AMBULATORY_CARE_PROVIDER_SITE_OTHER): Payer: PRIVATE HEALTH INSURANCE | Admitting: Internal Medicine

## 2014-08-08 ENCOUNTER — Encounter: Payer: Self-pay | Admitting: Internal Medicine

## 2014-08-08 VITALS — BP 136/82 | HR 94 | Temp 98.6°F | Resp 18 | Ht 68.5 in | Wt 285.0 lb

## 2014-08-08 DIAGNOSIS — B349 Viral infection, unspecified: Secondary | ICD-10-CM

## 2014-08-08 MED ORDER — AZITHROMYCIN 250 MG PO TABS: ORAL_TABLET | ORAL | Status: DC

## 2014-08-08 MED ORDER — PREDNISONE 20 MG PO TABS: ORAL_TABLET | ORAL | Status: DC

## 2014-08-08 NOTE — Progress Notes (Signed)
Subjective:    Patient ID: Kathleen Weaver, female    DOB: Jul 07, 1975, 39 y.o.   MRN: [id number]  Cough Associated symptoms include chills, a fever, headaches, myalgias, postnasal drip, rhinorrhea, a sore throat and shortness of breath. Pertinent negatives include no wheezing. Her past medical history is significant for environmental allergies.  Headache  Associated symptoms include coughing, a fever, rhinorrhea and a sore throat. Pertinent negatives include no abdominal pain, dizziness, nausea or vomiting.  Fever  Associated symptoms include congestion, coughing, headaches and a sore throat. Pertinent negatives include no abdominal pain, diarrhea, nausea, vomiting or wheezing.  Shortness of Breath Associated symptoms include a fever, headaches, rhinorrhea and a sore throat. Pertinent negatives include no abdominal pain, vomiting or wheezing.   Patient reports that last week she was feeling run down and had some stomach issues, cough, and mild congestion.  She saw employee health which thought that this was likely allergies.  She reports that she has had fever for 2 days.  She reports that her daughter had been diagnosed flu.  She is here today because she needs a doctors note because she cannot be out for more than a couple days.  She had a max temperature of 101.7.  She has been taking tylenol and motrin which has been taking care of her fevers.  She is eating and drinking well.     Review of Systems  Constitutional: Positive for fever, chills, diaphoresis and fatigue.  HENT: Positive for congestion, postnasal drip, rhinorrhea and sore throat.   Respiratory: Positive for cough, chest tightness and shortness of breath. Negative for wheezing.   Gastrointestinal: Negative for nausea, vomiting, abdominal pain, diarrhea and constipation.  Musculoskeletal: Positive for myalgias.  Allergic/Immunologic: Positive for environmental allergies.  Neurological: Positive for headaches. Negative for  dizziness and light-headedness.       Objective:   Physical Exam  Constitutional: She is oriented to person, place, and time. She appears well-developed and well-nourished. No distress.  HENT:  Head: Normocephalic and atraumatic.  Mouth/Throat: Oropharynx is clear and moist. No oropharyngeal exudate.  Eyes: Conjunctivae and EOM are normal. Pupils are equal, round, and reactive to light. No scleral icterus.  Neck: Normal range of motion. Neck supple. No JVD present. No thyromegaly present.  Cardiovascular: Normal rate, regular rhythm, normal heart sounds and intact distal pulses.  Exam reveals no gallop and no friction rub.   No murmur heard. Pulmonary/Chest: Effort normal and breath sounds normal. No respiratory distress. She has no wheezes. She has no rales. She exhibits no tenderness.  Abdominal: Soft. Bowel sounds are normal. She exhibits no distension and no mass. There is no tenderness. There is no rebound and no guarding.  Musculoskeletal: Normal range of motion.  Lymphadenopathy:    She has no cervical adenopathy.  Neurological: She is alert and oriented to person, place, and time.  Skin: Skin is warm and dry. She is not diaphoretic.  Psychiatric: She has a normal mood and affect. Her behavior is normal. Judgment and thought content normal.  Nursing note and vitals reviewed.    Filed Vitals:   08/08/14 1432  BP: 136/82  Pulse: 94  Temp: 98.6 F (37 C)  Resp: 18        Assessment & Plan:    1. Viral syndrome -likely influenza given childs diagnosis but could also be other viral infection.  Always possible for concurrent pneumonia but history supports that patient may be improving.  Will try treating symptomatically with prednisone and if  no improvement recommend zpak and possible CXR. Patient to f/u prn.

## 2014-08-08 NOTE — Patient Instructions (Signed)

## 2014-08-14 ENCOUNTER — Encounter: Payer: Self-pay | Admitting: Internal Medicine

## 2014-08-14 ENCOUNTER — Other Ambulatory Visit: Payer: Self-pay | Admitting: Internal Medicine

## 2014-08-14 ENCOUNTER — Ambulatory Visit (HOSPITAL_COMMUNITY)
Admission: RE | Admit: 2014-08-14 | Discharge: 2014-08-14 | Disposition: A | Discharge status: Home / Self Care | Payer: PRIVATE HEALTH INSURANCE | Source: Ambulatory Visit | Attending: Internal Medicine | Admitting: Internal Medicine

## 2014-08-14 DIAGNOSIS — R05 Cough: Secondary | ICD-10-CM | POA: Diagnosis not present

## 2014-08-14 DIAGNOSIS — J00 Acute nasopharyngitis [common cold]: Secondary | ICD-10-CM | POA: Insufficient documentation

## 2014-08-14 DIAGNOSIS — R059 Cough, unspecified: Secondary | ICD-10-CM

## 2014-08-14 MED ORDER — PROMETHAZINE-CODEINE 6.25-10 MG/5ML PO SYRP
5.0000 mL | ORAL_SOLUTION | Freq: Four times a day (QID) | ORAL | Status: DC | PRN
Start: 2014-08-14 — End: 2014-11-26

## 2014-08-28 ENCOUNTER — Other Ambulatory Visit: Payer: Self-pay | Admitting: *Deleted

## 2014-08-28 DIAGNOSIS — F988 Other specified behavioral and emotional disorders with onset usually occurring in childhood and adolescence: Secondary | ICD-10-CM

## 2014-08-28 MED ORDER — AMPHETAMINE-DEXTROAMPHETAMINE 20 MG PO TABS: ORAL_TABLET | ORAL | Status: DC

## 2014-08-30 ENCOUNTER — Encounter: Payer: Self-pay | Admitting: *Deleted

## 2014-09-26 ENCOUNTER — Other Ambulatory Visit: Payer: Self-pay | Admitting: Internal Medicine

## 2014-09-26 DIAGNOSIS — F988 Other specified behavioral and emotional disorders with onset usually occurring in childhood and adolescence: Secondary | ICD-10-CM

## 2014-09-26 MED ORDER — AMPHETAMINE-DEXTROAMPHETAMINE 20 MG PO TABS: ORAL_TABLET | ORAL | Status: DC

## 2014-10-18 ENCOUNTER — Encounter (HOSPITAL_COMMUNITY): Payer: Self-pay | Admitting: Obstetrics and Gynecology

## 2014-10-23 ENCOUNTER — Ambulatory Visit: Payer: Self-pay | Admitting: Internal Medicine

## 2014-10-25 ENCOUNTER — Other Ambulatory Visit: Payer: Self-pay | Admitting: Internal Medicine

## 2014-10-25 DIAGNOSIS — F988 Other specified behavioral and emotional disorders with onset usually occurring in childhood and adolescence: Secondary | ICD-10-CM

## 2014-10-25 MED ORDER — AMPHETAMINE-DEXTROAMPHETAMINE 20 MG PO TABS: ORAL_TABLET | ORAL | Status: DC

## 2014-10-25 NOTE — Addendum Note (Signed)
Addended by: Vicie Mutters R on: 10/25/2014 02:45 PM   Modules accepted: Orders

## 2014-11-05 ENCOUNTER — Other Ambulatory Visit: Payer: Self-pay

## 2014-11-26 ENCOUNTER — Other Ambulatory Visit: Payer: Self-pay | Admitting: Physician Assistant

## 2014-11-26 DIAGNOSIS — F988 Other specified behavioral and emotional disorders with onset usually occurring in childhood and adolescence: Secondary | ICD-10-CM

## 2014-11-26 MED ORDER — AMPHETAMINE-DEXTROAMPHETAMINE 20 MG PO TABS: ORAL_TABLET | ORAL | Status: DC

## 2014-11-26 NOTE — Telephone Encounter (Signed)
Will give 1 adderall prescriptoin but she is overdue for follow up OV

## 2014-12-10 ENCOUNTER — Encounter: Payer: Self-pay | Admitting: Physician Assistant

## 2014-12-10 ENCOUNTER — Ambulatory Visit (INDEPENDENT_AMBULATORY_CARE_PROVIDER_SITE_OTHER): Payer: PRIVATE HEALTH INSURANCE | Admitting: Physician Assistant

## 2014-12-10 ENCOUNTER — Other Ambulatory Visit: Payer: Self-pay | Admitting: Physician Assistant

## 2014-12-10 VITALS — BP 154/92 | HR 80 | Temp 97.5°F | Resp 16 | Ht 68.5 in | Wt 286.8 lb

## 2014-12-10 DIAGNOSIS — E669 Obesity, unspecified: Secondary | ICD-10-CM

## 2014-12-10 DIAGNOSIS — R739 Hyperglycemia, unspecified: Secondary | ICD-10-CM

## 2014-12-10 DIAGNOSIS — E782 Mixed hyperlipidemia: Secondary | ICD-10-CM

## 2014-12-10 DIAGNOSIS — I10 Essential (primary) hypertension: Secondary | ICD-10-CM

## 2014-12-10 DIAGNOSIS — Z79899 Other long term (current) drug therapy: Secondary | ICD-10-CM

## 2014-12-10 DIAGNOSIS — D649 Anemia, unspecified: Secondary | ICD-10-CM | POA: Diagnosis not present

## 2014-12-10 DIAGNOSIS — E559 Vitamin D deficiency, unspecified: Secondary | ICD-10-CM

## 2014-12-10 LAB — CBC WITH DIFFERENTIAL/PLATELET
BASOS PCT: 0 % (ref 0–1)
Basophils Absolute: 0 10*3/uL (ref 0.0–0.1)
Eosinophils Absolute: 0.2 10*3/uL (ref 0.0–0.7)
Eosinophils Relative: 2 % (ref 0–5)
HCT: 31.8 % — ABNORMAL LOW (ref 36.0–46.0)
Hemoglobin: 10.3 g/dL — ABNORMAL LOW (ref 12.0–15.0)
LYMPHS ABS: 2.2 10*3/uL (ref 0.7–4.0)
LYMPHS PCT: 27 % (ref 12–46)
MCH: 26.1 pg (ref 26.0–34.0)
MCHC: 32.4 g/dL (ref 30.0–36.0)
MCV: 80.5 fL (ref 78.0–100.0)
MONO ABS: 0.6 10*3/uL (ref 0.1–1.0)
MPV: 9.2 fL (ref 8.6–12.4)
Monocytes Relative: 7 % (ref 3–12)
NEUTROS ABS: 5.2 10*3/uL (ref 1.7–7.7)
Neutrophils Relative %: 64 % (ref 43–77)
Platelets: 339 10*3/uL (ref 150–400)
RBC: 3.95 MIL/uL (ref 3.87–5.11)
RDW: 15.7 % — AB (ref 11.5–15.5)
WBC: 8.2 10*3/uL (ref 4.0–10.5)

## 2014-12-10 MED ORDER — OMEPRAZOLE 20 MG PO CPDR: 20.0000 mg | DELAYED_RELEASE_CAPSULE | Freq: Every day | ORAL | Status: DC

## 2014-12-10 MED ORDER — MELOXICAM 15 MG PO TABS: ORAL_TABLET | ORAL | Status: DC

## 2014-12-10 MED ORDER — SERTRALINE HCL 100 MG PO TABS: 100.0000 mg | ORAL_TABLET | Freq: Every day | ORAL | Status: DC
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
## 2014-12-10 NOTE — Patient Instructions (Addendum)
We are starting you on Metformin to prevent or treat diabetes. Metformin does not cause low blood sugars. In order to create energy your cells need insulin and sugar but sometime your cells do not accept the insulin and this can cause increased sugars and decreased energy. The Metformin helps your cells accept insulin and the sugar to give you more energy.   The two most common side effects are nausea and diarrhea, follow these rules to avoid it! You can take imodium per box instructions when starting metformin if needed.   Rules of metformin: 1) start out slow with only one pill daily. Our goal for you is 4 pills a day or 2000mg  total.  2) take with your largest meal. 3) Take with least amount of carbs.   Call if you have any problems.   Monitor your blood pressure at home, if it is above 140/90 consistently call the office so we can adjust your medications. Go to the ER if any CP, SOB, nausea, dizziness, severe HA, changes vision/speech  DASH Eating Plan DASH stands for "Dietary Approaches to Stop Hypertension." The DASH eating plan is a healthy eating plan that has been shown to reduce high blood pressure (hypertension). Additional health benefits may include reducing the risk of type 2 diabetes mellitus, heart disease, and stroke. The DASH eating plan may also help with weight loss. WHAT DO I NEED TO KNOW ABOUT THE DASH EATING PLAN? For the DASH eating plan, you will follow these general guidelines:  Choose foods with a percent daily value for sodium of less than 5% (as listed on the food label).  Use salt-free seasonings or herbs instead of table salt or sea salt.  Check with your health care provider or pharmacist before using salt substitutes.  Eat lower-sodium products, often labeled as "lower sodium" or "no salt added."  Eat fresh foods.  Eat more vegetables, fruits, and low-fat dairy products.  Choose whole grains. Look for the word "whole" as the first word in the ingredient  list.  Choose fish and skinless chicken or [place] more often than red meat. Limit fish, poultry, and meat to 6 oz (170 g) each day.  Limit sweets, desserts, sugars, and sugary drinks.  Choose heart-healthy fats.  Limit cheese to 1 oz (28 g) per day.  Eat more home-cooked food and less restaurant, buffet, and fast food.  Limit fried foods.  Cook foods using methods other than frying.  Limit canned vegetables. If you do use them, rinse them well to decrease the sodium.  When eating at a restaurant, ask that your food be prepared with less salt, or no salt if possible. WHAT FOODS CAN I EAT? Seek help from a dietitian for individual calorie needs. Grains Whole grain or whole wheat bread. Brown rice. Whole grain or whole wheat pasta. Quinoa, bulgur, and whole grain cereals. Low-sodium cereals. Corn or whole wheat flour tortillas. Whole grain cornbread. Whole grain crackers. Low-sodium crackers. Vegetables Fresh or frozen vegetables (raw, steamed, roasted, or grilled). Low-sodium or reduced-sodium tomato and vegetable juices. Low-sodium or reduced-sodium tomato sauce and paste. Low-sodium or reduced-sodium canned vegetables.  Fruits All fresh, canned (in natural juice), or frozen fruits. Meat and Other Protein Products Ground beef (85% or leaner), grass-fed beef, or beef trimmed of fat. Skinless chicken or [place]. Ground chicken or [place]. Pork trimmed of fat. All fish and seafood. Eggs. Dried beans, peas, or lentils. Unsalted nuts and seeds. Unsalted canned beans. Dairy Low-fat dairy products, such as skim or 1%  milk, 2% or reduced-fat cheeses, low-fat ricotta or cottage cheese, or plain low-fat yogurt. Low-sodium or reduced-sodium cheeses. Fats and Oils Tub margarines without trans fats. Light or reduced-fat mayonnaise and salad dressings (reduced sodium). Avocado. Safflower, olive, or canola oils. Natural peanut or almond butter. Other Unsalted popcorn and pretzels. The items listed  above may not be a complete list of recommended foods or beverages. Contact your dietitian for more options. WHAT FOODS ARE NOT RECOMMENDED? Grains White bread. White pasta. White rice. Refined cornbread. Bagels and croissants. Crackers that contain trans fat. Vegetables Creamed or fried vegetables. Vegetables in a cheese sauce. Regular canned vegetables. Regular canned tomato sauce and paste. Regular tomato and vegetable juices. Fruits Dried fruits. Canned fruit in light or heavy syrup. Fruit juice. Meat and Other Protein Products Fatty cuts of meat. Ribs, chicken wings, bacon, sausage, bologna, salami, chitterlings, fatback, hot dogs, bratwurst, and packaged luncheon meats. Salted nuts and seeds. Canned beans with salt. Dairy Whole or 2% milk, cream, half-and-half, and cream cheese. Whole-fat or sweetened yogurt. Full-fat cheeses or blue cheese. Nondairy creamers and whipped toppings. Processed cheese, cheese spreads, or cheese curds. Condiments Onion and garlic salt, seasoned salt, table salt, and sea salt. Canned and packaged gravies. Worcestershire sauce. Tartar sauce. Barbecue sauce. Teriyaki sauce. Soy sauce, including reduced sodium. Steak sauce. Fish sauce. Oyster sauce. Cocktail sauce. Horseradish. Ketchup and mustard. Meat flavorings and tenderizers. Bouillon cubes. Hot sauce. Tabasco sauce. Marinades. Taco seasonings. Relishes. Fats and Oils Butter, stick margarine, lard, shortening, ghee, and bacon fat. Coconut, palm kernel, or palm oils. Regular salad dressings. Other Pickles and olives. Salted popcorn and pretzels. The items listed above may not be a complete list of foods and beverages to avoid. Contact your dietitian for more information. WHERE CAN I FIND MORE INFORMATION? National Heart, Lung, and Blood Institute: travelstabloid.com Document Released: 04/16/2011 Document Revised: 09/11/2013 Document Reviewed: 03/01/2013 [facility] Patient  Information 2015 [place], [place]. This information is not intended to replace advice given to you by your health care provider. Make sure you discuss any questions you have with your health care provider.  Please pick one of the over the counter allergy medications below and take it once daily for allergies.  Claritin or loratadine cheapest but likely the weakest  Zyrtec or certizine at night because it can make you sleepy The strongest is allegra or fexafinadine  Cheapest at [employer], [employer], [employer]  We want weight loss that will last so you should lose 1-2 pounds a week.  THAT IS IT! Please pick THREE things a month to change. Once it is a habit check off the item. Then pick another three items off the list to become habits.  If you are already doing a habit on the list GREAT!  Cross that item off! o Don't drink your calories. Ie, alcohol, soda, fruit juice, and sweet tea.  o Drink more water. Drink a glass when you feel hungry or before each meal.  o Eat breakfast - Complex carb and protein (likeDannon light and fit yogurt, oatmeal, fruit, eggs, [place] bacon). o Measure your cereal.  Eat no more than one cup a day. (ie [place]) o Eat an apple a day. o Add a vegetable a day. o Try a new vegetable a month. o Use Pam! Stop using oil or butter to cook. o Don't finish your plate or use smaller plates. o Share your dessert. o Eat sugar free Jello for dessert or frozen grapes. o Don't eat 2-3 hours before bed. o Switch  to whole wheat bread, pasta, and brown rice. o Make healthier choices when you eat out. No fries! o Pick baked chicken, NOT fried. o Don't forget to SLOW DOWN when you eat. It is not going anywhere.  o Take the stairs. o Park far away in the parking lot o [employer] (or weights) for 10 minutes while watching TV. o Walk at work for 10 minutes during break. o Walk outside 1 time a week with your friend, kids, dog, or significant other. o Start a walking group at [place]  the mall as much as you can tolerate.  o Keep a food diary. o Weigh yourself daily. o Walk for 15 minutes 3 days per week. o Cook at home more often and eat out less.  If life happens and you go back to old habits, it is okay.  Just start over. You can do it!   If you experience chest pain, get short of breath, or tired during the exercise, please stop immediately and inform your doctor.

## 2014-12-11 LAB — LIPID PANEL
CHOLESTEROL: 176 mg/dL (ref 125–200)
HDL: 42 mg/dL — ABNORMAL LOW (ref >=46)
LDL Cholesterol: 111 mg/dL (ref <130)
Total CHOL/HDL Ratio: 4.2 Ratio (ref <=5.0)
Triglycerides: 116 mg/dL (ref <150)
VLDL: 23 mg/dL (ref <30)

## 2014-12-11 LAB — BASIC METABOLIC PANEL WITH GFR
BUN: 11 mg/dL (ref 7–25)
CO2: 25 mmol/L (ref 20–31)
Calcium: 9 mg/dL (ref 8.6–10.2)
Chloride: 104 mmol/L (ref 98–110)
Creat: 0.69 mg/dL (ref 0.50–1.10)
GFR, Est African American: >89 mL/min (ref >=60)
GFR, Est Non African American: >89 mL/min (ref >=60)
Glucose, Bld: 86 mg/dL (ref 65–99)
Potassium: 4.5 mmol/L (ref 3.5–5.3)
SODIUM: 139 mmol/L (ref 135–146)

## 2014-12-11 LAB — INSULIN, FASTING: Insulin fasting, serum: 11.4 u[IU]/mL (ref 2.0–19.6)

## 2014-12-11 LAB — HEPATIC FUNCTION PANEL
ALBUMIN: 3.8 g/dL (ref 3.6–5.1)
ALT: 13 U/L (ref 6–29)
AST: 12 U/L (ref 10–30)
Alkaline Phosphatase: 71 U/L (ref 33–115)
BILIRUBIN TOTAL: 0.3 mg/dL (ref 0.2–1.2)
Bilirubin, Direct: 0.1 mg/dL (ref <=0.2)
Indirect Bilirubin: 0.2 mg/dL (ref 0.2–1.2)
TOTAL PROTEIN: 6.6 g/dL (ref 6.1–8.1)

## 2014-12-11 LAB — IRON AND TIBC
%SAT: 6 % — ABNORMAL LOW (ref 20–55)
Iron: 28 ug/dL — ABNORMAL LOW (ref 42–145)
TIBC: 453 ug/dL (ref 250–470)
UIBC: 425 ug/dL — ABNORMAL HIGH (ref 125–400)

## 2014-12-11 LAB — MAGNESIUM: MAGNESIUM: 1.8 mg/dL (ref 1.5–2.5)

## 2014-12-11 LAB — TSH: TSH: 3.297 u[IU]/mL (ref 0.350–4.500)

## 2014-12-11 LAB — VITAMIN B12: Vitamin B-12: 291 pg/mL (ref 211–911)

## 2014-12-11 LAB — FERRITIN: FERRITIN: 7 ng/mL — AB (ref 10–291)

## 2014-12-11 LAB — HEMOGLOBIN A1C
HEMOGLOBIN A1C: 6 % — AB (ref <5.7)
Mean Plasma Glucose: 126 mg/dL — ABNORMAL HIGH (ref <117)

## 2014-12-11 LAB — VITAMIN D 25 HYDROXY (VIT D DEFICIENCY, FRACTURES): Vit D, 25-Hydroxy: 26 ng/mL — ABNORMAL LOW (ref 30–100)

## 2014-12-25 ENCOUNTER — Other Ambulatory Visit: Payer: Self-pay | Admitting: Physician Assistant

## 2014-12-25 ENCOUNTER — Other Ambulatory Visit: Payer: Self-pay | Admitting: Internal Medicine

## 2014-12-25 DIAGNOSIS — F988 Other specified behavioral and emotional disorders with onset usually occurring in childhood and adolescence: Secondary | ICD-10-CM

## 2014-12-25 MED ORDER — AMPHETAMINE-DEXTROAMPHETAMINE 20 MG PO TABS: ORAL_TABLET | ORAL | Status: DC

## 2015-01-08 ENCOUNTER — Encounter: Payer: Self-pay | Admitting: Physician Assistant

## 2015-01-09 MED ORDER — FLUCONAZOLE 150 MG PO TABS: 150.0000 mg | ORAL_TABLET | Freq: Every day | ORAL | Status: DC

## 2015-01-25 ENCOUNTER — Encounter: Payer: Self-pay | Admitting: Physician Assistant

## 2015-01-25 ENCOUNTER — Other Ambulatory Visit: Payer: Self-pay | Admitting: Internal Medicine

## 2015-01-25 DIAGNOSIS — F988 Other specified behavioral and emotional disorders with onset usually occurring in childhood and adolescence: Secondary | ICD-10-CM

## 2015-01-25 MED ORDER — AMPHETAMINE-DEXTROAMPHETAMINE 20 MG PO TABS
ORAL_TABLET | ORAL | Status: DC
Start: 2015-01-25 — End: 2015-02-22

## 2015-02-01 ENCOUNTER — Emergency Department (HOSPITAL_BASED_OUTPATIENT_CLINIC_OR_DEPARTMENT_OTHER): Payer: PRIVATE HEALTH INSURANCE

## 2015-02-01 ENCOUNTER — Other Ambulatory Visit: Payer: Self-pay

## 2015-02-01 ENCOUNTER — Emergency Department (HOSPITAL_BASED_OUTPATIENT_CLINIC_OR_DEPARTMENT_OTHER)
Admission: EM | Admit: 2015-02-01 | Discharge: 2015-02-01 | Disposition: A | Discharge status: Home / Self Care | Payer: PRIVATE HEALTH INSURANCE | Attending: Emergency Medicine | Admitting: Emergency Medicine

## 2015-02-01 ENCOUNTER — Encounter (HOSPITAL_BASED_OUTPATIENT_CLINIC_OR_DEPARTMENT_OTHER): Payer: Self-pay | Admitting: *Deleted

## 2015-02-01 DIAGNOSIS — Z86018 Personal history of other benign neoplasm: Secondary | ICD-10-CM | POA: Diagnosis not present

## 2015-02-01 DIAGNOSIS — Z79899 Other long term (current) drug therapy: Secondary | ICD-10-CM | POA: Diagnosis not present

## 2015-02-01 DIAGNOSIS — Z720 Tobacco use: Secondary | ICD-10-CM | POA: Diagnosis not present

## 2015-02-01 DIAGNOSIS — I10 Essential (primary) hypertension: Secondary | ICD-10-CM | POA: Insufficient documentation

## 2015-02-01 DIAGNOSIS — K219 Gastro-esophageal reflux disease without esophagitis: Secondary | ICD-10-CM | POA: Insufficient documentation

## 2015-02-01 DIAGNOSIS — Z8639 Personal history of other endocrine, nutritional and metabolic disease: Secondary | ICD-10-CM | POA: Diagnosis not present

## 2015-02-01 DIAGNOSIS — Z91040 Latex allergy status: Secondary | ICD-10-CM | POA: Insufficient documentation

## 2015-02-01 DIAGNOSIS — Z8632 Personal history of gestational diabetes: Secondary | ICD-10-CM | POA: Diagnosis not present

## 2015-02-01 DIAGNOSIS — D649 Anemia, unspecified: Secondary | ICD-10-CM | POA: Diagnosis not present

## 2015-02-01 DIAGNOSIS — F419 Anxiety disorder, unspecified: Secondary | ICD-10-CM | POA: Insufficient documentation

## 2015-02-01 DIAGNOSIS — R42 Dizziness and giddiness: Secondary | ICD-10-CM | POA: Diagnosis present

## 2015-02-01 DIAGNOSIS — F909 Attention-deficit hyperactivity disorder, unspecified type: Secondary | ICD-10-CM | POA: Diagnosis not present

## 2015-02-01 DIAGNOSIS — R519 Headache, unspecified: Secondary | ICD-10-CM

## 2015-02-01 DIAGNOSIS — R51 Headache: Secondary | ICD-10-CM | POA: Diagnosis not present

## 2015-02-01 DIAGNOSIS — H8112 Benign paroxysmal vertigo, left ear: Secondary | ICD-10-CM | POA: Diagnosis not present

## 2015-02-01 HISTORY — DX: Irritable bowel syndrome, unspecified: K58.9

## 2015-02-01 LAB — CBC WITH DIFFERENTIAL/PLATELET
BASOS ABS: 0 10*3/uL (ref 0.0–0.1)
Basophils Relative: 0 %
Eosinophils Absolute: 0.1 10*3/uL (ref 0.0–0.7)
Eosinophils Relative: 1 %
HEMATOCRIT: 31.5 % — AB (ref 36.0–46.0)
Hemoglobin: 9.7 g/dL — ABNORMAL LOW (ref 12.0–15.0)
LYMPHS ABS: 1.2 10*3/uL (ref 0.7–4.0)
LYMPHS PCT: 17 %
MCH: 25.3 pg — AB (ref 26.0–34.0)
MCHC: 30.8 g/dL (ref 30.0–36.0)
MCV: 82 fL (ref 78.0–100.0)
MONO ABS: 0.5 10*3/uL (ref 0.1–1.0)
Monocytes Relative: 6 %
NEUTROS ABS: 5.5 10*3/uL (ref 1.7–7.7)
Neutrophils Relative %: 76 %
Platelets: 323 10*3/uL (ref 150–400)
RBC: 3.84 MIL/uL — ABNORMAL LOW (ref 3.87–5.11)
RDW: 15.8 % — AB (ref 11.5–15.5)
WBC: 7.3 10*3/uL (ref 4.0–10.5)

## 2015-02-01 LAB — BASIC METABOLIC PANEL
Anion gap: 5 (ref 5–15)
BUN: 11 mg/dL (ref 6–20)
CO2: 26 mmol/L (ref 22–32)
Calcium: 8.4 mg/dL — ABNORMAL LOW (ref 8.9–10.3)
Chloride: 107 mmol/L (ref 101–111)
Creatinine, Ser: 0.61 mg/dL (ref 0.44–1.00)
GFR calc Af Amer: >60 mL/min (ref >60)
GFR calc non Af Amer: >60 mL/min (ref >60)
GLUCOSE: 111 mg/dL — AB (ref 65–99)
POTASSIUM: 4.1 mmol/L (ref 3.5–5.1)
Sodium: 138 mmol/L (ref 135–145)

## 2015-02-01 MED ORDER — MECLIZINE HCL 50 MG PO TABS: 25.0000 mg | ORAL_TABLET | Freq: Three times a day (TID) | ORAL | Status: DC | PRN

## 2015-02-01 MED ORDER — ONDANSETRON HCL 4 MG PO TABS: 4.0000 mg | ORAL_TABLET | Freq: Three times a day (TID) | ORAL | Status: DC | PRN

## 2015-02-01 NOTE — Discharge Instructions (Signed)
View the You Tube video by Dr. Richrd Sox from the [place]   Benign Positional Vertigo Vertigo means you feel like you or your surroundings are moving when they are not. Benign positional vertigo is the most common form of vertigo. Benign means that the cause of your condition is not serious. Benign positional vertigo is more common in older adults. CAUSES  Benign positional vertigo is the result of an upset in the labyrinth system. This is an area in the middle ear that helps control your balance. This may be caused by a viral infection, head injury, or repetitive motion. However, often no specific cause is found. SYMPTOMS  Symptoms of benign positional vertigo occur when you move your head or eyes in different directions. Some of the symptoms may include:  Loss of balance and falls.  Vomiting.  Blurred vision.  Dizziness.  Nausea.  Involuntary eye movements (nystagmus). DIAGNOSIS  Benign positional vertigo is usually diagnosed by physical exam. If the specific cause of your benign positional vertigo is unknown, your caregiver may perform imaging tests, such as magnetic resonance imaging (MRI) or computed tomography (CT). TREATMENT  Your caregiver may recommend movements or procedures to correct the benign positional vertigo. Medicines such as meclizine, benzodiazepines, and medicines for nausea may be used to treat your symptoms. In rare cases, if your symptoms are caused by certain conditions that affect the inner ear, you may need surgery. HOME CARE INSTRUCTIONS   Follow your caregiver's instructions.  Move slowly. Do not make sudden body or head movements.  Avoid driving.  Avoid operating heavy machinery.  Avoid performing any tasks that would be dangerous to you or others during a vertigo episode.  Drink enough fluids to keep your urine clear or pale yellow. SEEK IMMEDIATE MEDICAL CARE IF:   You develop problems with walking, weakness, numbness,  or using your arms, hands, or legs.  You have difficulty speaking.  You develop severe headaches.  Your nausea or vomiting continues or gets worse.  You develop visual changes.  Your family or friends notice any behavioral changes.  Your condition gets worse.  You have a fever.  You develop a stiff neck or sensitivity to light. MAKE SURE YOU:   Understand these instructions.  Will watch your condition.  Will get help right away if you are not doing well or get worse. Document Released: 02/02/2006 Document Revised: 07/20/2011 Document Reviewed: 01/15/2011 [facility] Patient Information 2015 [place], [place]. This information is not intended to replace advice given to you by your health care provider. Make sure you discuss any questions you have with your health care provider.  Anemia, Nonspecific Anemia is a condition in which the concentration of red blood cells or hemoglobin in the blood is below normal. Hemoglobin is a substance in red blood cells that carries oxygen to the tissues of the body. Anemia results in not enough oxygen reaching these tissues.  CAUSES  Common causes of anemia include:   Excessive bleeding. Bleeding may be internal or external. This includes excessive bleeding from periods (in women) or from the intestine.   Poor nutrition.   Chronic kidney, thyroid, and liver disease.  Bone marrow disorders that decrease red blood cell production.  Cancer and treatments for cancer.  HIV, AIDS, and their treatments.  Spleen problems that increase red blood cell destruction.  Blood disorders.  Excess destruction of red blood cells due to infection, medicines, and autoimmune disorders. SIGNS AND SYMPTOMS   Minor weakness.   Dizziness.  Headache.  Palpitations.   Shortness of breath, especially with exercise.   Paleness.  Cold sensitivity.  Indigestion.  Nausea.  Difficulty sleeping.  Difficulty concentrating. Symptoms may occur  suddenly or they may develop slowly.  DIAGNOSIS  Additional blood tests are often needed. These help your health care provider determine the best treatment. Your health care provider will check your stool for blood and look for other causes of blood loss.  TREATMENT  Treatment varies depending on the cause of the anemia. Treatment can include:   Supplements of iron, vitamin T01, or folic acid.   Hormone medicines.   A blood transfusion. This may be needed if blood loss is severe.   Hospitalization. This may be needed if there is significant continual blood loss.   Dietary changes.  Spleen removal. HOME CARE INSTRUCTIONS Keep all follow-up appointments. It often takes many weeks to correct anemia, and having your health care provider check on your condition and your response to treatment is very important. SEEK IMMEDIATE MEDICAL CARE IF:   You develop extreme weakness, shortness of breath, or chest pain.   You become dizzy or have trouble concentrating.  You develop heavy vaginal bleeding.   You develop a rash.   You have bloody or black, tarry stools.   You faint.   You vomit up blood.   You vomit repeatedly.   You have abdominal pain.  You have a fever or persistent symptoms for more than 2-3 days.   You have a fever and your symptoms suddenly get worse.   You are dehydrated.  MAKE SURE YOU:  Understand these instructions.  Will watch your condition.  Will get help right away if you are not doing well or get worse. Document Released: 06/04/2004 Document Revised: 12/28/2012 Document Reviewed: 10/21/2012 [facility] Patient Information 2015 [place], [place]. This information is not intended to replace advice given to you by your health care provider. Make sure you discuss any questions you have with your health care provider.

## 2015-02-01 NOTE — ED Notes (Signed)
Patient states she woke up this morning and was so dizzy that she could not get out of bed.  States over the last month she has had problems with her allergies, sinuses and the worse headaches she has ever had.  States the dizziness has improved since waking the morning, but continues and is associated with nausea.

## 2015-02-01 NOTE — ED Provider Notes (Signed)
CSN: [id number]     Arrival date & time 02/01/15  7026 History   First MD Initiated Contact with Patient 02/01/15 [phone]     Chief Complaint  Patient presents with  . Dizziness     (Consider location/radiation/quality/duration/timing/severity/associated sxs/prior Treatment) Patient is a 39 y.o. female presenting with dizziness. The history is provided by the patient.  Dizziness Quality:  Vertigo Severity:  Moderate Onset quality:  Sudden Timing:  Intermittent Progression:  Waxing and waning Chronicity:  New Context: head movement   Relieved by:  Being still Worsened by:  Movement Ineffective treatments:  None tried Associated symptoms: vomiting   Risk factors: anemia   Risk factors: no hx of vertigo     Past Medical History  Diagnosis Date  . Short cervix affecting pregnancy 04/20/2013  . Cervical cerclage suture present 04/21/2013  . Fibroid   . Abnormal Pap smear     bx x2; LEEP  . Mental disorder     mild bipolar  . Hypertension     no meds   . Gestational diabetes mellitus, antepartum     GDM  . Anxiety     no meds  . Asthma   . GERD (gastroesophageal reflux disease)   . ADD (attention deficit disorder)   . Hyperlipidemia   . PCOS (polycystic ovarian syndrome)   . IBS (irritable bowel syndrome)    Past Surgical History  Procedure Laterality Date  . Cervical cerclage N/A 04/21/2013    Procedure: CERCLAGE CERVICAL;  Surgeon: Thornell Sartorius, MD;  Location: [facility];  Service: Gynecology;  Laterality: N/A;  . Cesarean section      x 2  . Tonsillectomy    . Bartholin cyst marsupialization    . Therapeutic abortion    . Wisdom tooth extraction    . Leep    . Cesarean section N/A 08/31/2013    Procedure: CESAREAN SECTION;  Surgeon: Melina Schools, MD;  Location: [facility];  Service: Obstetrics;  Laterality: N/A;  . Tubal ligation     Family History  Problem Relation Age of Onset  . Cancer Mother     2 cousins- w/breast  . Hypertension Mother   . Cancer Paternal  Uncle   . Hypertension Maternal Grandmother   . Stroke Maternal Grandmother   . Diabetes Maternal Grandfather   . Heart disease Maternal Grandfather   . Hypertension Maternal Grandfather   . Diabetes Paternal Grandmother   . Heart disease Paternal Grandfather   . Hypertension Paternal Grandfather   . Hearing loss Neg Hx   . Cancer Maternal Aunt     lung/ breast   Social History  Substance Use Topics  . Smoking status: Current Every Day Smoker -- 0.50 packs/day for 25 years    Types: Cigarettes  . Smokeless tobacco: Never Used  . Alcohol Use: No   OB History    Gravida Para Term Preterm AB TAB SAB Ectopic Multiple Living   6 3 3  3 2 1   3      Review of Systems  Gastrointestinal: Positive for vomiting.  Neurological: Positive for dizziness.      Allergies  Lisinopril; Bisoprolol; Latex; and Mobic  Home Medications   Prior to Admission medications   Medication Sig Start Date End Date Taking? Authorizing Provider  albuterol (PROVENTIL HFA;VENTOLIN HFA) 108 (90 BASE) MCG/ACT inhaler Inhale 1 puff into the lungs every 6 (six) hours as needed for wheezing or shortness of breath. 11/29/13  Yes Vicie Mutters, PA-C  amphetamine-dextroamphetamine (ADDERALL)  20 MG tablet Take 1/2 to 1 tablet 2 x day if needed for ADD 01/25/15 02/23/15 Yes Vicie Mutters, PA-C  Cetirizine HCl (ZYRTEC ALLERGY) 10 MG CAPS Take 1 capsule (10 mg total) by mouth daily. 06/21/13  Yes Allen Norris, MD  losartan-hydrochlorothiazide (HYZAAR) 100-25 MG per tablet Take 1 tablet by mouth daily. 07/03/14  Yes Kelby Aline, PA-C  metFORMIN (GLUCOPHAGE XR) 500 MG 24 hr tablet Take 1 tablet (500 mg total) by mouth daily with breakfast. 07/05/14 07/05/15 Yes Melissa Smith, PA-C  omeprazole (PRILOSEC) 20 MG capsule Take 1 capsule (20 mg total) by mouth daily. 12/10/14  Yes Vicie Mutters, PA-C  sertraline (ZOLOFT) 100 MG tablet Take 1 tablet (100 mg total) by mouth daily. 12/10/14  Yes Vicie Mutters, PA-C  fluconazole  (DIFLUCAN) 150 MG tablet Take 1 tablet (150 mg total) by mouth daily. 01/09/15   Vicie Mutters, PA-C  ibuprofen (ADVIL,MOTRIN) 600 MG tablet Take 1 tablet (600 mg total) by mouth every 6 (six) hours. 09/02/13   Paula Compton, MD  meclizine (ANTIVERT) 50 MG tablet Take 0.5 tablets (25 mg total) by mouth 3 (three) times daily as needed. 02/01/15   Leonard Schwartz, MD  meloxicam (MOBIC) 15 MG tablet Take one daily with food for 2 weeks, can take with tylenol, can not take with aleve, iburpofen, then as needed daily for pain 12/10/14   Vicie Mutters, PA-C   BP 144/81 mmHg  Pulse 65  Temp(Src) 98.4 F (36.9 C) (Oral)  Resp 18  Ht 5\' 8"  (1.727 m)  Wt 285 lb (129.275 kg)  BMI 43.34 kg/m2  SpO2 100%  LMP 01/18/2015 Physical Exam Physical Exam  Nursing note and vitals reviewed. Constitutional: She is oriented to person, place, and time. She appears well-developed and well-nourished. No distress.  HENT:  Head: Normocephalic and atraumatic.  Eyes: Pupils are equal, round, and reactive to light.  Patient has horizontal nystagmus in turn her head to the left which stops after about 30-45 seconds.   Neck: Normal range of motion.  Cardiovascular: Normal rate and intact distal pulses.   Pulmonary/Chest: No respiratory distress.  Abdominal: Normal appearance. She exhibits no distension.  Musculoskeletal: Normal range of motion.  Neurological: She is alert and oriented to person, place, and time. No cranial nerve deficit.  Skin: Skin is warm and dry. No rash noted.  Psychiatric: She has a normal mood and affect. Her behavior is normal.   ED Course  Procedures (including critical care time) Labs Review Labs Reviewed  CBC WITH DIFFERENTIAL/PLATELET - Abnormal; Notable for the following:    RBC 3.84 (*)    Hemoglobin 9.7 (*)    HCT 31.5 (*)    MCH 25.3 (*)    RDW 15.8 (*)    All other components within normal limits  BASIC METABOLIC PANEL - Abnormal; Notable for the following:    Glucose, Bld 111  (*)    Calcium 8.4 (*)    All other components within normal limits    Imaging Review Dg Chest 2 View  02/01/2015   CLINICAL DATA:  Patient states she woke up this morning and was so dizzy that she could not get out of bed. States over the last month she has had problems with her allergies, sinuses and the worse headaches she has ever had. States dizziness has improved since this morning, but there is continued nausea.  EXAM: CHEST  2 VIEW  COMPARISON:  08/14/2014  FINDINGS: The heart size and mediastinal contours are within normal  limits. Both lungs are clear. No pleural effusion or pneumothorax. The visualized skeletal structures are unremarkable.  IMPRESSION: No active cardiopulmonary disease.   Electronically Signed   By: Lajean Manes M.D.   On: 02/01/2015 09:52   Ct Head Wo Contrast  02/01/2015   CLINICAL DATA:  Patient states she woke up this morning and was so dizzy that she could not get out of bed. States over the last month she has had problems with her allergies, sinuses and the worse headaches she has ever had. States the dizziness has improved since waking the morning, but continues and is associated with nausea.  EXAM: CT HEAD WITHOUT CONTRAST  CT MAXILLOFACIAL WITHOUT CONTRAST  TECHNIQUE: Multidetector CT imaging of the head and maxillofacial structures were performed using the standard protocol without intravenous contrast. Multiplanar CT image reconstructions of the maxillofacial structures were also generated.  COMPARISON:  None.  FINDINGS: CT HEAD FINDINGS  Ventricles are normal in size and configuration. There are no parenchymal masses or mass effect. There is no evidence of an infarct. There are no extra-axial masses or abnormal fluid collections.  There is no intracranial hemorrhage.  No skull lesion.  CT MAXILLOFACIAL FINDINGS  No fracture. Sinuses are clear. Mild anatomic narrowing is noted of the ostiomeatal complexes due to low-lying ethmoid air cells. Septum is near midline.  There are no areas of bone resorption or sclerosis.  No soft tissue abnormality. Normal globes and orbits. No adenopathy.  Mastoid air cells and middle ear cavities are clear.  IMPRESSION: HEAD CT:  Normal.  MAXILLOFACIAL CT: Clear sinuses. Mild anatomic narrowing of the ostiomeatal complexes. Otherwise unremarkable.   Electronically Signed   By: Lajean Manes M.D.   On: 02/01/2015 09:56   Ct Maxillofacial Wo Cm  02/01/2015   CLINICAL DATA:  Patient states she woke up this morning and was so dizzy that she could not get out of bed. States over the last month she has had problems with her allergies, sinuses and the worse headaches she has ever had. States the dizziness has improved since waking the morning, but continues and is associated with nausea.  EXAM: CT HEAD WITHOUT CONTRAST  CT MAXILLOFACIAL WITHOUT CONTRAST  TECHNIQUE: Multidetector CT imaging of the head and maxillofacial structures were performed using the standard protocol without intravenous contrast. Multiplanar CT image reconstructions of the maxillofacial structures were also generated.  COMPARISON:  None.  FINDINGS: CT HEAD FINDINGS  Ventricles are normal in size and configuration. There are no parenchymal masses or mass effect. There is no evidence of an infarct. There are no extra-axial masses or abnormal fluid collections.  There is no intracranial hemorrhage.  No skull lesion.  CT MAXILLOFACIAL FINDINGS  No fracture. Sinuses are clear. Mild anatomic narrowing is noted of the ostiomeatal complexes due to low-lying ethmoid air cells. Septum is near midline. There are no areas of bone resorption or sclerosis.  No soft tissue abnormality. Normal globes and orbits. No adenopathy.  Mastoid air cells and middle ear cavities are clear.  IMPRESSION: HEAD CT:  Normal.  MAXILLOFACIAL CT: Clear sinuses. Mild anatomic narrowing of the ostiomeatal complexes. Otherwise unremarkable.   Electronically Signed   By: Lajean Manes M.D.   On: 02/01/2015 09:56    I have personally reviewed and evaluated these images and lab results as part of my medical decision-making.  EKG:  Normal ECG  MDM   Final diagnoses:  Facial pain  Anemia, unspecified anemia type  BPV (benign positional vertigo), left  Leonard Schwartz, MD 02/01/15 1013

## 2015-02-12 ENCOUNTER — Encounter: Payer: Self-pay | Admitting: Physician Assistant

## 2015-02-21 ENCOUNTER — Other Ambulatory Visit: Payer: Self-pay | Admitting: Physician Assistant

## 2015-02-21 DIAGNOSIS — F988 Other specified behavioral and emotional disorders with onset usually occurring in childhood and adolescence: Secondary | ICD-10-CM

## 2015-02-22 MED ORDER — AMPHETAMINE-DEXTROAMPHETAMINE 20 MG PO TABS: ORAL_TABLET | ORAL | Status: DC

## 2015-03-04 ENCOUNTER — Encounter: Payer: Self-pay | Admitting: Physician Assistant

## 2015-03-05 ENCOUNTER — Encounter: Payer: Self-pay | Admitting: Physician Assistant

## 2015-03-05 ENCOUNTER — Ambulatory Visit (INDEPENDENT_AMBULATORY_CARE_PROVIDER_SITE_OTHER): Payer: PRIVATE HEALTH INSURANCE | Admitting: Physician Assistant

## 2015-03-05 VITALS — BP 120/66 | HR 81 | Temp 97.9°F | Resp 16 | Ht 68.5 in | Wt 286.0 lb

## 2015-03-05 DIAGNOSIS — D649 Anemia, unspecified: Secondary | ICD-10-CM

## 2015-03-05 DIAGNOSIS — H8112 Benign paroxysmal vertigo, left ear: Secondary | ICD-10-CM

## 2015-03-05 LAB — CBC WITH DIFFERENTIAL/PLATELET
BASOS ABS: 0 10*3/uL (ref 0.0–0.1)
BASOS PCT: 0 % (ref 0–1)
Eosinophils Absolute: 0.1 10*3/uL (ref 0.0–0.7)
Eosinophils Relative: 2 % (ref 0–5)
HEMATOCRIT: 33.9 % — AB (ref 36.0–46.0)
HEMOGLOBIN: 10.7 g/dL — AB (ref 12.0–15.0)
LYMPHS PCT: 29 % (ref 12–46)
Lymphs Abs: 2.1 10*3/uL (ref 0.7–4.0)
MCH: 25.4 pg — ABNORMAL LOW (ref 26.0–34.0)
MCHC: 31.6 g/dL (ref 30.0–36.0)
MCV: 80.3 fL (ref 78.0–100.0)
MPV: 9.1 fL (ref 8.6–12.4)
Monocytes Absolute: 0.5 10*3/uL (ref 0.1–1.0)
Monocytes Relative: 7 % (ref 3–12)
NEUTROS PCT: 62 % (ref 43–77)
Neutro Abs: 4.6 10*3/uL (ref 1.7–7.7)
Platelets: 391 10*3/uL (ref 150–400)
RBC: 4.22 MIL/uL (ref 3.87–5.11)
RDW: 17 % — ABNORMAL HIGH (ref 11.5–15.5)
WBC: 7.4 10*3/uL (ref 4.0–10.5)

## 2015-03-05 LAB — IRON AND TIBC
%SAT: 22 % (ref 11–50)
Iron: 98 ug/dL (ref 40–190)
TIBC: 437 ug/dL (ref 250–450)
UIBC: 339 ug/dL (ref 125–400)

## 2015-03-05 LAB — FERRITIN: FERRITIN: 16 ng/mL (ref 10–291)

## 2015-03-05 MED ORDER — MECLIZINE HCL 50 MG PO TABS: 25.0000 mg | ORAL_TABLET | Freq: Three times a day (TID) | ORAL | Status: DC | PRN

## 2015-03-05 MED ORDER — ONDANSETRON HCL 4 MG PO TABS: 4.0000 mg | ORAL_TABLET | Freq: Three times a day (TID) | ORAL | Status: DC | PRN

## 2015-03-05 NOTE — Patient Instructions (Addendum)
Depending on CBC Can do iron pill WITH PROBIOTIC and VITAMIN C  Benign Paroxysmal Positional Vertigo (BPPV)  General Information In Benign Paroxysmal Positional Vertigo (BPPV) dizziness is generally thought to be due to debris which has collected within a part of the inner ear. This debris can be thought of as "ear rocks", although the formal name is "otoconia". Ear rocks are small crystals of calcium carbonate derived from a structure in the ear called the "utricle" (figure to the right ). The symptoms of BPPV include dizziness or vertigo, lightheadedness, imbalance, and nausea. Activities which bring on symptoms will vary among persons, but symptoms are usually followed by a change of position of the head like getting out of bed or rolling over in bed are common "problem" motions .  However if you have worsening HA, changes vision/speech, weakness go to the ER     What can be done? 1) Use two or more pillows at night. Avoid sleeping on the "bad" side. In the morning, get up slowly and sit on the edge of the bed for a minute. 2) Medication prescribed by your doctor.  3) The exercises below, you can do at home to help you prevent the sensation later in the day.  4) We can refer you to physical therapy at [facility], # [place] treatments: The Brandt-Daroff Exercises are a home method of treating BPPV,and are effective 95% of the time.  These exercises are performed in three sets per day for two weeks. In each set, one performs the maneuver as shown five times. Start sitting upright (position 1). Then move into the side-lying position (position 2), with the head angled upward about halfway. An easy way to remember this is to imagine someone standing about 6 feet in front of you, and just keep looking at their head at all times. Stay in the side-lying position for 30 seconds, or until the dizziness subsides if this is longer, then go back to the sitting position  (position 3). Stay there for 30 seconds, and then go to the opposite side (position 4) and follow the same routine.  At home Epley Maneuver This procedure seems to be even more effective than the in-office procedure, perhaps because it is repeated every night for a week.  The method (for the left side) is performed as shown on the figure below.  1) One stays in each of the supine (lying down) positions for 30 seconds, and in the sitting upright position (top) for 1 minute.  2) Thus, once cycle takes 2 1/2 minutes.  3) Typically 3 cycles are performed just prior to going to sleep.  4) It is best to do them at night rather than in the morning or midday, as if one becomes dizzy following the exercises, then it can resolve while one is sleeping.  The mirror image of this procedure is used for the right ear.

## 2015-03-05 NOTE — Progress Notes (Signed)
Subjective:    Patient ID: Kathleen Weaver, female    DOB: 02/20/1976, 39 y.o.   MRN: [id number]  HPI 39 y.o. obese WF with history of ADD, anxiety, bipolar, preDM presents with vertigo. She went to the ER on 02/01/2015 with vertigo, she had normal CT Head/sinuses at that visit.   She states that she had vertigo morning of 02/01/2015, tried to sit up in bed but fell down due to vertigo, could not walk at that time, had nausea/dry heaving, has improved but continues to have vertigo. Will make it hard for her to drive, does not want to get up from bed due to dizziness, feels that it is affecting her concentration. She has taken the meclizine once a day, does not take it at night. Feels that vertigo is worse with moving her head to the left, rolling up from bed on the left. She drove here today. She is concerned with her work, works at [employer] and has been missing work, she has missed 09/23 and yesterday due to the vertigo. 1 day a week for 2-3 months.   She did have anemia, has bad menstrual periods.   Lab Results  Component Value Date   WBC 7.3 02/01/2015   HGB 9.7* 02/01/2015   HCT 31.5* 02/01/2015   MCV 82.0 02/01/2015   PLT 323 02/01/2015    Blood pressure 120/66, pulse 81, temperature 97.9 F (36.6 C), temperature source Temporal, resp. rate 16, height 5' 8.5" (1.74 m), weight 286 lb (129.729 kg), last menstrual period 02/12/2015, SpO2 99 %, unknown if currently breastfeeding.  Past Medical History  Diagnosis Date  . Short cervix affecting pregnancy 04/20/2013  . Cervical cerclage suture present 04/21/2013  . Fibroid   . Abnormal Pap smear     bx x2; LEEP  . Mental disorder     mild bipolar  . Hypertension     no meds   . Gestational diabetes mellitus, antepartum     GDM  . Anxiety     no meds  . Asthma   . GERD (gastroesophageal reflux disease)   . ADD (attention deficit disorder)   . Hyperlipidemia   . PCOS (polycystic ovarian syndrome)   . IBS (irritable bowel  syndrome)    Current Outpatient Prescriptions on File Prior to Visit  Medication Sig Dispense Refill  . albuterol (PROVENTIL HFA;VENTOLIN HFA) 108 (90 BASE) MCG/ACT inhaler Inhale 1 puff into the lungs every 6 (six) hours as needed for wheezing or shortness of breath. 16 g 3  . amphetamine-dextroamphetamine (ADDERALL) 20 MG tablet Take 1/2 to 1 tablet 2 x day if needed for ADD 60 tablet 0  . Cetirizine HCl (ZYRTEC ALLERGY) 10 MG CAPS Take 1 capsule (10 mg total) by mouth daily. 90 capsule 1  . fluconazole (DIFLUCAN) 150 MG tablet Take 1 tablet (150 mg total) by mouth daily. 1 tablet 0  . ibuprofen (ADVIL,MOTRIN) 600 MG tablet Take 1 tablet (600 mg total) by mouth every 6 (six) hours. 30 tablet 0  . losartan-hydrochlorothiazide (HYZAAR) 100-25 MG per tablet Take 1 tablet by mouth daily. 90 tablet 1  . meclizine (ANTIVERT) 50 MG tablet Take 0.5 tablets (25 mg total) by mouth 3 (three) times daily as needed. 30 tablet 0  . metFORMIN (GLUCOPHAGE XR) 500 MG 24 hr tablet Take 1 tablet (500 mg total) by mouth daily with breakfast. 90 tablet 1  . omeprazole (PRILOSEC) 20 MG capsule Take 1 capsule (20 mg total) by mouth daily. 90 capsule 1  .  ondansetron (ZOFRAN) 4 MG tablet Take 1 tablet (4 mg total) by mouth every 8 (eight) hours as needed for nausea or vomiting. 20 tablet 0  . sertraline (ZOLOFT) 100 MG tablet Take 1 tablet (100 mg total) by mouth daily. 90 tablet 1   No current facility-administered medications on file prior to visit.    Review of Systems  Constitutional: Negative.   HENT: Negative.   Respiratory: Negative.   Cardiovascular: Negative.   Gastrointestinal: Negative.   Genitourinary: Negative.   Musculoskeletal: Negative.   Neurological: Positive for dizziness. Negative for tremors, seizures, syncope, facial asymmetry, speech difficulty, weakness, light-headedness, numbness and headaches.  Hematological: Negative.   Psychiatric/Behavioral: Negative.        Objective:    Physical Exam  Constitutional: She is oriented to person, place, and time. She appears well-developed and well-nourished.  HENT:  Head: Normocephalic and atraumatic.  Cardiovascular: Normal rate and regular rhythm.   Pulmonary/Chest: Effort normal and breath sounds normal.  Abdominal: Soft. Bowel sounds are normal.  Musculoskeletal: Normal range of motion.  Neurological: She is alert and oriented to person, place, and time. No cranial nerve deficit.  + dix hallpike to the left with horizontal nystagmus       Assessment & Plan:  Vertigo- nonsmoker, normal neuro, normal CT scan, does have history of vertigo in the past, meclizine take at night and AM, will send to [facility], will fill out FMLA for work, if worsening HA, changes vision/speech, imbalance, weakness go to the ER

## 2015-03-21 ENCOUNTER — Encounter: Payer: Self-pay | Admitting: Physician Assistant

## 2015-03-28 ENCOUNTER — Other Ambulatory Visit: Payer: Self-pay | Admitting: Physician Assistant

## 2015-03-28 ENCOUNTER — Other Ambulatory Visit: Payer: Self-pay | Admitting: Internal Medicine

## 2015-03-28 DIAGNOSIS — F988 Other specified behavioral and emotional disorders with onset usually occurring in childhood and adolescence: Secondary | ICD-10-CM

## 2015-03-28 MED ORDER — AMPHETAMINE-DEXTROAMPHETAMINE 20 MG PO TABS: ORAL_TABLET | ORAL | Status: DC

## 2015-04-01 ENCOUNTER — Encounter: Payer: Self-pay | Admitting: Internal Medicine

## 2015-04-01 ENCOUNTER — Ambulatory Visit (INDEPENDENT_AMBULATORY_CARE_PROVIDER_SITE_OTHER): Payer: PRIVATE HEALTH INSURANCE | Admitting: Internal Medicine

## 2015-04-01 VITALS — BP 182/98 | HR 74 | Temp 97.9°F | Resp 18 | Ht 68.5 in | Wt 290.0 lb

## 2015-04-01 DIAGNOSIS — J4541 Moderate persistent asthma with (acute) exacerbation: Secondary | ICD-10-CM | POA: Diagnosis not present

## 2015-04-01 MED ORDER — PHENYLEPH-PROMETHAZINE-COD 5-6.25-10 MG/5ML PO SYRP: ORAL_SOLUTION | ORAL | Status: DC

## 2015-04-01 MED ORDER — DOXYCYCLINE HYCLATE 100 MG PO CAPS: 100.0000 mg | ORAL_CAPSULE | Freq: Two times a day (BID) | ORAL | Status: DC

## 2015-04-01 MED ORDER — PREDNISONE 20 MG PO TABS: ORAL_TABLET | ORAL | Status: DC

## 2015-04-01 NOTE — Patient Instructions (Signed)
Please use breo once daily.  Make sure you rinse your mouth out after using it.  Take the phenergan codeine as needed for severe coughing.  Do not drive or operate heavy machinery within 4 hours of taking.  Take your zyrtec daily.  Use your albuterol every 4-6 hours as needed for coughing and shortness of breath.  Please rinse your nose with saline.  Take prednisone until it is gone.  If no better in 2-3 days take the doxycycline.

## 2015-04-01 NOTE — Progress Notes (Signed)
Patient ID: Kathleen Weaver, female   DOB: 09/16/1975, 39 y.o.   MRN: [id number]  HPI  Patient presents to the office for evaluation of cough with history of moderate asthma.  It has been going on for 1 weeks.  Patient reports all the time, dry, barky.  They also endorse change in voice, postnasal drip, shortness of breath and wheezing.  They have tried inhalers: albuterol.  They report that nothing has worked.  They admits to other sick contacts. She reports that her mother in law had something similar going on last weekend. Baby also has URI currently.  Review of Systems  Constitutional: Positive for malaise/fatigue. Negative for fever and chills.  HENT: Positive for congestion and sore throat.   Respiratory: Positive for cough, sputum production, shortness of breath and wheezing.   Cardiovascular: Negative for chest pain, palpitations and leg swelling.    PE:  Filed Vitals:   04/01/15 1145  BP: 182/98  Pulse: 74  Temp: 97.9 F (36.6 C)  Resp: 18     General:  Alert and non-toxic, WDWN, NAD HEENT: NCAT, PERLA, EOM normal, no occular discharge or erythema.  Nasal mucosal edema with sinus tenderness to palpation.  Oropharynx clear with minimal oropharyngeal edema and erythema.  Mucous membranes moist and pink. Neck:  Cervical adenopathy Chest:  RRR no MRGs.  Lungs clear to auscultation A&P with no wheezes rhonchi or rales.   Abdomen: +BS x 4 quadrants, soft, non-tender, no guarding, rigidity, or rebound. Skin: warm and dry no rash Neuro: A&Ox4, CN II-XII grossly intact  Assessment and Plan:   1. Asthma, moderate persistent, with acute exacerbation -prednisone -doxcycyline -breo sample given -albuterol q6hrs -phenergan codeine syrup -nasal saline -flonase -zyrtec

## 2015-04-30 ENCOUNTER — Other Ambulatory Visit: Payer: Self-pay | Admitting: Internal Medicine

## 2015-04-30 DIAGNOSIS — F988 Other specified behavioral and emotional disorders with onset usually occurring in childhood and adolescence: Secondary | ICD-10-CM

## 2015-04-30 MED ORDER — AMPHETAMINE-DEXTROAMPHETAMINE 20 MG PO TABS: ORAL_TABLET | ORAL | Status: DC

## 2015-04-30 NOTE — Telephone Encounter (Signed)
Medication will be up front for pick up. Needs future appointment.

## 2015-05-27 ENCOUNTER — Ambulatory Visit (INDEPENDENT_AMBULATORY_CARE_PROVIDER_SITE_OTHER): Payer: PRIVATE HEALTH INSURANCE | Admitting: Physician Assistant

## 2015-05-27 ENCOUNTER — Encounter: Payer: Self-pay | Admitting: Physician Assistant

## 2015-05-27 VITALS — BP 132/80 | HR 82 | Temp 98.1°F | Resp 16 | Ht 68.5 in | Wt 298.0 lb

## 2015-05-27 DIAGNOSIS — J0100 Acute maxillary sinusitis, unspecified: Secondary | ICD-10-CM | POA: Diagnosis not present

## 2015-05-27 DIAGNOSIS — E785 Hyperlipidemia, unspecified: Secondary | ICD-10-CM

## 2015-05-27 DIAGNOSIS — D649 Anemia, unspecified: Secondary | ICD-10-CM

## 2015-05-27 DIAGNOSIS — F988 Other specified behavioral and emotional disorders with onset usually occurring in childhood and adolescence: Secondary | ICD-10-CM

## 2015-05-27 DIAGNOSIS — E559 Vitamin D deficiency, unspecified: Secondary | ICD-10-CM | POA: Diagnosis not present

## 2015-05-27 DIAGNOSIS — J4541 Moderate persistent asthma with (acute) exacerbation: Secondary | ICD-10-CM

## 2015-05-27 DIAGNOSIS — Z79899 Other long term (current) drug therapy: Secondary | ICD-10-CM | POA: Diagnosis not present

## 2015-05-27 DIAGNOSIS — F909 Attention-deficit hyperactivity disorder, unspecified type: Secondary | ICD-10-CM

## 2015-05-27 DIAGNOSIS — R7309 Other abnormal glucose: Secondary | ICD-10-CM | POA: Diagnosis not present

## 2015-05-27 DIAGNOSIS — I10 Essential (primary) hypertension: Secondary | ICD-10-CM | POA: Diagnosis not present

## 2015-05-27 DIAGNOSIS — E282 Polycystic ovarian syndrome: Secondary | ICD-10-CM

## 2015-05-27 LAB — CBC WITH DIFFERENTIAL/PLATELET
BASOS ABS: 0 10*3/uL (ref 0.0–0.1)
BASOS PCT: 0 % (ref 0–1)
EOS ABS: 0.1 10*3/uL (ref 0.0–0.7)
Eosinophils Relative: 2 % (ref 0–5)
HCT: 30.6 % — ABNORMAL LOW (ref 36.0–46.0)
Hemoglobin: 10.1 g/dL — ABNORMAL LOW (ref 12.0–15.0)
Lymphocytes Relative: 26 % (ref 12–46)
Lymphs Abs: 1.9 10*3/uL (ref 0.7–4.0)
MCH: 26.2 pg (ref 26.0–34.0)
MCHC: 33 g/dL (ref 30.0–36.0)
MCV: 79.3 fL (ref 78.0–100.0)
MONOS PCT: 7 % (ref 3–12)
MPV: 8.9 fL (ref 8.6–12.4)
Monocytes Absolute: 0.5 10*3/uL (ref 0.1–1.0)
NEUTROS ABS: 4.8 10*3/uL (ref 1.7–7.7)
NEUTROS PCT: 65 % (ref 43–77)
PLATELETS: 327 10*3/uL (ref 150–400)
RBC: 3.86 MIL/uL — ABNORMAL LOW (ref 3.87–5.11)
RDW: 16.8 % — ABNORMAL HIGH (ref 11.5–15.5)
WBC: 7.4 10*3/uL (ref 4.0–10.5)

## 2015-05-27 MED ORDER — PROMETHAZINE-CODEINE 6.25-10 MG/5ML PO SYRP: 5.0000 mL | ORAL_SOLUTION | Freq: Four times a day (QID) | ORAL | Status: DC | PRN

## 2015-05-27 MED ORDER — LEVOFLOXACIN 500 MG PO TABS: 500.0000 mg | ORAL_TABLET | Freq: Every day | ORAL | Status: DC

## 2015-05-27 MED ORDER — AMPHETAMINE-DEXTROAMPHETAMINE 20 MG PO TABS: ORAL_TABLET | ORAL | Status: DC

## 2015-05-27 MED ORDER — MONTELUKAST SODIUM 10 MG PO TABS: 10.0000 mg | ORAL_TABLET | Freq: Every day | ORAL | Status: DC

## 2015-05-27 NOTE — Progress Notes (Signed)
Assessment and Plan:  1. Hypertension -Continue medication, monitor blood pressure at home. Continue DASH diet.  Reminder to go to the ER if any CP, SOB, nausea, dizziness, severe HA, changes vision/speech, left arm numbness and tingling and jaw pain.  2. Cholesterol -Continue diet and exercise. Check cholesterol.   3. Prediabetes  -Continue diet and exercise. Check A1C  4. Vitamin D Def - check level and continue medications.   5. Asthma, moderate persistent, with acute exacerbation - montelukast (SINGULAIR) 10 MG tablet; Take 1 tablet (10 mg total) by mouth daily.  Dispense: 30 tablet; Refill: 2  6. Anemia, unspecified anemia type - Ferritin - Iron and TIBC  7. Morbid obesity, unspecified obesity type ([place]) Obesity with co morbidities- long discussion about weight loss, diet, and exercise  8. ADD (attention deficit disorder) - amphetamine-dextroamphetamine (ADDERALL) 20 MG tablet; Take 1/2 to 1 tablet 2 x day if needed for ADD  Dispense: 60 tablet; Refill: 0  8. Acute maxillary sinusitis, recurrence not specified - levofloxacin (LEVAQUIN) 500 MG tablet; Take 1 tablet (500 mg total) by mouth daily.  Dispense: 10 tablet; Refill: 0 - promethazine-codeine (PHENERGAN WITH CODEINE) 6.25-10 MG/5ML syrup; Take 5 mLs by mouth every 6 (six) hours as needed for cough. Max: 68mL per day  Dispense: 240 mL; Refill: 0    Continue diet and meds as discussed. Further disposition pending results of labs. Over 30 minutes of exam, counseling, chart review, and critical decision making was performed  HPI 40 y.o. female  presents for 3 month follow up on hypertension, cholesterol, prediabetes, and vitamin D deficiency.   Her blood pressure has been controlled at home, today their BP is BP: 132/80 mmHg  She does not workout. She denies chest pain, shortness of breath, dizziness.  She is not on cholesterol medication and denies myalgias. Her cholesterol is at goal. The cholesterol last visit was:    Lab Results  Component Value Date   CHOL 176 12/10/2014   HDL 42* 12/10/2014   LDLCALC 111 12/10/2014   TRIG 116 12/10/2014   CHOLHDL 4.2 12/10/2014    She has been working on diet and exercise for prediabetes, she is on metformin, and denies paresthesia of the feet, polydipsia, polyuria and visual disturbances. Last A1C in the office was:  Lab Results  Component Value Date   HGBA1C 6.0* 12/10/2014   Patient is on Vitamin D supplement.   Lab Results  Component Value Date   VD25OH 66* 12/10/2014     She has history of asthma, was treated 04/01/2015, got better but sinus issues never got better and has gotten worse the last week. Treated with doxy, prednisone.  BMI is Body mass index is 44.65 kg/(m^2)., she is working on diet and exercise. Wt Readings from Last 3 Encounters:  05/27/15 298 lb (135.172 kg)  04/01/15 290 lb (131.543 kg)  03/05/15 286 lb (129.729 kg)    Current Medications:  Current Outpatient Prescriptions on File Prior to Visit  Medication Sig Dispense Refill  . albuterol (PROVENTIL HFA;VENTOLIN HFA) 108 (90 BASE) MCG/ACT inhaler Inhale 1 puff into the lungs every 6 (six) hours as needed for wheezing or shortness of breath. 16 g 3  . amphetamine-dextroamphetamine (ADDERALL) 20 MG tablet Take 1/2 to 1 tablet 2 x day if needed for ADD 60 tablet 0  . Cetirizine HCl (ZYRTEC ALLERGY) 10 MG CAPS Take 1 capsule (10 mg total) by mouth daily. 90 capsule 1  . ibuprofen (ADVIL,MOTRIN) 600 MG tablet Take 1 tablet (600  mg total) by mouth every 6 (six) hours. 30 tablet 0  . losartan-hydrochlorothiazide (HYZAAR) 100-25 MG per tablet Take 1 tablet by mouth daily. 90 tablet 1  . meclizine (ANTIVERT) 50 MG tablet Take 0.5 tablets (25 mg total) by mouth 3 (three) times daily as needed for dizziness or nausea. 60 tablet 1  . metFORMIN (GLUCOPHAGE XR) 500 MG 24 hr tablet Take 1 tablet (500 mg total) by mouth daily with breakfast. 90 tablet 1  . omeprazole (PRILOSEC) 20 MG capsule Take 1  capsule (20 mg total) by mouth daily. 90 capsule 1  . ondansetron (ZOFRAN) 4 MG tablet Take 1 tablet (4 mg total) by mouth every 8 (eight) hours as needed for nausea or vomiting. 30 tablet 1  . sertraline (ZOLOFT) 100 MG tablet Take 1 tablet (100 mg total) by mouth daily. 90 tablet 1   No current facility-administered medications on file prior to visit.   Medical History:  Past Medical History  Diagnosis Date  . Short cervix affecting pregnancy 04/20/2013  . Cervical cerclage suture present 04/21/2013  . Fibroid   . Abnormal Pap smear     bx x2; LEEP  . Mental disorder     mild bipolar  . Hypertension     no meds   . Gestational diabetes mellitus, antepartum     GDM  . Anxiety     no meds  . Asthma   . GERD (gastroesophageal reflux disease)   . ADD (attention deficit disorder)   . Hyperlipidemia   . PCOS (polycystic ovarian syndrome)   . IBS (irritable bowel syndrome)    Allergies:  Allergies  Allergen Reactions  . Lisinopril Shortness Of Breath and Cough  . Bisoprolol     Chest tightness   . Latex Rash  . Mobic [Meloxicam] Itching     Review of Systems:  Review of Systems  Constitutional: Positive for malaise/fatigue. Negative for fever and chills.  HENT: Positive for congestion and ear pain. Negative for sore throat.   Respiratory: Positive for cough, sputum production, shortness of breath and wheezing.   Cardiovascular: Negative for chest pain, palpitations and leg swelling.  Gastrointestinal: Negative.   Genitourinary: Negative.   Musculoskeletal: Negative.   Neurological: Negative.   Psychiatric/Behavioral: Negative.     Family history- Review and unchanged Social history- Review and unchanged Physical Exam: BP 132/80 mmHg  Pulse 82  Temp(Src) 98.1 F (36.7 C) (Temporal)  Resp 16  Ht 5' 8.5" (1.74 m)  Wt 298 lb (135.172 kg)  BMI 44.65 kg/m2  SpO2 99%  LMP 05/16/2015 Wt Readings from Last 3 Encounters:  05/27/15 298 lb (135.172 kg)  04/01/15 290  lb (131.543 kg)  03/05/15 286 lb (129.729 kg)   General Appearance: Well nourished, in no apparent distress. Eyes: PERRLA, EOMs, conjunctiva no swelling or erythema Sinuses: + Frontal/maxillary tenderness ENT/Mouth: Ext aud canals clear, TMs without erythema, bulging. No erythema, swelling, or exudate on post pharynx.  Tonsils not swollen or erythematous. Hearing normal.  Neck: Supple, thyroid normal.  Respiratory: Respiratory effort normal, BS equal bilaterally without rales, rhonchi, wheezing or stridor.  Cardio: RRR with no MRGs. Brisk peripheral pulses without edema.  Abdomen: Soft, + BS,  Non tender, no guarding, rebound, hernias, masses. Lymphatics: Non tender without lymphadenopathy.  Musculoskeletal: Full ROM, 5/5 strength, Normal gait Skin: Warm, dry without rashes, lesions, ecchymosis.  Neuro: Cranial nerves intact. Normal muscle tone, no cerebellar symptoms. Psych: Awake and oriented X 3, normal affect, Insight and Judgment appropriate.  Vicie Mutters, PA-C 3:13 PM [facility]

## 2015-05-27 NOTE — Patient Instructions (Signed)
Sinusitis can be uncomfortable. People with sinusitis have congestion with yellow/green/gray discharge, sinus pain/pressure, pain around the eyes. Sinus infections almost ALWAYS stem from a viral infection and antibiotics don't work against a virus. Even when bacteria is responsible, the infections usually clear up on their own in a week or so.   PLEASE TRY TO DO OVER THE COUNTER TREATMENT AND PREDNISONE FOR 5-7 DAYS AND IF YOU ARE NOT GETTING BETTER OR GETTING WORSE THEN YOU CAN START ON AN ANTIBIOTIC GIVEN.  Can take the prednisone AT NIGHT WITH DINNER, it take 8-12 hours to start working so it will NOT affect your sleeping if you take it at night with your food!! Take two pills the first night and 1 or two pill the second night and then 1 pill the other nights.   Risk of antibiotic use: About 1 in 4 people who take antibiotics have side effects including stomach problems, dizziness, or rashes. Those problems clear up soon after stopping the drugs, but in rare cases antibiotics can cause severe allergic reaction. Over use of antibiotics also encourages the growth of bacteria that can't be controlled easily with drugs. That makes you more vunerable to antibiotic-resistant infections and undermines the benefits of antibiotics for others.   Waste of Money: Antibiotics often aren't very expensive, but any money spent on unnecessary drugs is money down the drain.   When are antibiotics needed? Only when symptoms last longer than a week.  Start to improve but then worsen again  -It can take up to 2 weeks to feel better.   -If you do not get better in 7-10 days (Have fever, facial pain, dental pain and swelling), then please call the office and it is now appropriate to start an antibiotic.   -Please take Tylenol or Ibuprofen for pain. -Acetaminiphen 325mg orally every 4-6 hours for pain.  Max: 10 per day -Ibuprofen 200mg orally every 6-8 hours for pain.  Take with food to avoid ulcers.   Max 10 per  day  Please pick one of the over the counter allergy medications below and take it once daily for allergies.  Claritin or loratadine cheapest but likely the weakest  Zyrtec or certizine at night because it can make you sleepy The strongest is allegra or fexafinadine  Cheapest at [facility], [employer], [employer]  -While drinking fluids, pinch and hold nose close and swallow.  This will help open up your eustachian tubes to drain the fluid behind your ear drums. -Try steam showers to open your nasal passages.   Drink lots of water to stay hydrated and to thin mucous.  Flonase/Nasonex is to help the inflammation.  Take 2 sprays in each nostril at bedtime.  Make sure you spray towards the outside of each nostril towards the outer corner of your eye, hold nose close and tilt head back.  This will help the medication get into your sinuses.  If you do not like this medication, then use saline nasal sprays same directions as above for Flonase. Stop the medication right away if you get blurring of your vision or nose bleeds.  Sinusitis Sinusitis is redness, soreness, and inflammation of the paranasal sinuses. Paranasal sinuses are air pockets within the bones of your face (beneath the eyes, the middle of the forehead, or above the eyes). In healthy paranasal sinuses, mucus is able to drain out, and air is able to circulate through them by way of your nose. However, when your paranasal sinuses are inflamed, mucus and air can   become trapped. This can allow bacteria and other germs to grow and cause infection. Sinusitis can develop quickly and last only a short time (acute) or continue over a long period (chronic). Sinusitis that lasts for more than 12 weeks is considered chronic.  CAUSES  Causes of sinusitis include: Allergies. Structural abnormalities, such as displacement of the cartilage that separates your nostrils (deviated septum), which can decrease the air flow through your nose and sinuses and affect sinus  drainage. Functional abnormalities, such as when the small hairs (cilia) that line your sinuses and help remove mucus do not work properly or are not present. SIGNS AND SYMPTOMS  Symptoms of acute and chronic sinusitis are the same. The primary symptoms are pain and pressure around the affected sinuses. Other symptoms include: Upper toothache. Earache. Headache. Bad breath. Decreased sense of smell and taste. A cough, which worsens when you are lying flat. Fatigue. Fever. Thick drainage from your nose, which often is green and may contain pus (purulent). Swelling and warmth over the affected sinuses. DIAGNOSIS  Your health care provider will perform a physical exam. During the exam, your health care provider may: Look in your nose for signs of abnormal growths in your nostrils (nasal polyps).  Tap over the affected sinus to check for signs of infection. View the inside of your sinuses (endoscopy) using an imaging device that has a light attached (endoscope). If your health care provider suspects that you have chronic sinusitis, one or more of the following tests may be recommended: Allergy tests. Nasal culture. A sample of mucus is taken from your nose, sent to a lab, and screened for bacteria. Nasal cytology. A sample of mucus is taken from your nose and examined by your health care provider to determine if your sinusitis is related to an allergy. TREATMENT  Most cases of acute sinusitis are related to a viral infection and will resolve on their own within 10 days. Sometimes medicines are prescribed to help relieve symptoms (pain medicine, decongestants, nasal steroid sprays, or saline sprays).  However, for sinusitis related to a bacterial infection, your health care provider will prescribe antibiotic medicines. These are medicines that will help kill the bacteria causing the infection.  Rarely, sinusitis is caused by a fungal infection. In theses cases, your health care provider will  prescribe antifungal medicine. For some cases of chronic sinusitis, surgery is needed. Generally, these are cases in which sinusitis recurs more than 3 times per year, despite other treatments. HOME CARE INSTRUCTIONS  Drink plenty of water. Water helps thin the mucus so your sinuses can drain more easily. Use a humidifier. Inhale steam 3 to 4 times a day (for example, sit in the bathroom with the shower running). Apply a warm, moist washcloth to your face 3 to 4 times a day, or as directed by your health care provider. Use saline nasal sprays to help moisten and clean your sinuses. Take medicines only as directed by your health care provider. If you were prescribed either an antibiotic or antifungal medicine, finish it all even if you start to feel better. SEEK IMMEDIATE MEDICAL CARE IF: You have increasing pain or severe headaches. You have nausea, vomiting, or drowsiness. You have swelling around your face. You have vision problems. You have a stiff neck. You have difficulty breathing. MAKE SURE YOU:  Understand these instructions. Will watch your condition. Will get help right away if you are not doing well or get worse. Document Released: 04/27/2005 Document Revised: 09/11/2013 Document Reviewed: 05/12/2011 ExitCare   Patient Information 2015 [place]. This information is not intended to replace advice given to you by your health care provider. Make sure you discuss any questions you have with your health care provider.   We want weight loss that will last so you should lose 1-2 pounds a week.  THAT IS IT! Please pick THREE things a month to change. Once it is a habit check off the item. Then pick another three items off the list to become habits.  If you are already doing a habit on the list GREAT!  Cross that item off! o Don't drink your calories. Ie, alcohol, soda, fruit juice, and sweet tea.  o Drink more water. Drink a glass when you feel hungry or before each meal.  o Eat  breakfast - Complex carb and protein (likeDannon light and fit yogurt, oatmeal, fruit, eggs, [place] bacon). o Measure your cereal.  Eat no more than one cup a day. (ie [place]) o Eat an apple a day. o Add a vegetable a day. o Try a new vegetable a month. o Use Pam! Stop using oil or butter to cook. o Don't finish your plate or use smaller plates. o Share your dessert. o Eat sugar free Jello for dessert or frozen grapes. o Don't eat 2-3 hours before bed. o Switch to whole wheat bread, pasta, and brown rice. o Make healthier choices when you eat out. No fries! o Pick baked chicken, NOT fried. o Don't forget to SLOW DOWN when you eat. It is not going anywhere.  o Take the stairs. o Park far away in the parking lot o [employer] (or weights) for 10 minutes while watching TV. o Walk at work for 10 minutes during break. o Walk outside 1 time a week with your friend, kids, dog, or significant other. o Start a walking group at [place] the mall as much as you can tolerate.  o Keep a food diary. o Weigh yourself daily. o Walk for 15 minutes 3 days per week. o Cook at home more often and eat out less.  If life happens and you go back to old habits, it is okay.  Just start over. You can do it!   If you experience chest pain, get short of breath, or tired during the exercise, please stop immediately and inform your doctor.   Before you even begin to attack a weight-loss plan, it pays to remember this: You are not fat. You have fat. Losing weight isn't about blame or shame; it's simply another achievement to accomplish. Dieting is like any other skill-you have to buckle down and work at it. As long as you act in a smart, reasonable way, you'll ultimately get where you want to be. Here are some weight loss pearls for you.  1. It's Not a Diet. It's a Lifestyle Thinking of a diet as something you're on and suffering through only for the short term doesn't work. To shed weight and keep it off,  you need to make permanent changes to the way you eat. It's OK to indulge occasionally, of course, but if you cut calories temporarily and then revert to your old way of eating, you'll gain back the weight quicker than you can say yo-yo. Use it to lose it. Research shows that one of the best predictors of long-term weight loss is how many pounds you drop in the first month. For that reason, nutritionists often suggest being stricter for the first two weeks of your  new eating strategy to build momentum. Cut out added sugar and alcohol and avoid unrefined carbs. After that, figure out how you can reincorporate them in a way that's healthy and maintainable.  2. There's a Right Way to Exercise Working out burns calories and fat and boosts your metabolism by building muscle. But those trying to lose weight are notorious for overestimating the number of calories they burn and underestimating the amount they take in. Unfortunately, your system is biologically programmed to hold on to extra pounds and that means when you start exercising, your body senses the deficit and ramps up its hunger signals. If you're not diligent, you'll eat everything you burn and then some. Use it to lose it. Cardio gets all the exercise glory, but strength and interval training are the real heroes. They help you build lean muscle, which in turn increases your metabolism and calorie-burning ability 3. Don't Overreact to Mild Hunger Some people have a hard time losing weight because of hunger anxiety. To them, being hungry is bad-something to be avoided at all costs-so they carry snacks with them and eat when they don't need to. Others eat because they're stressed out or bored. While you never want to get to the point of being ravenous (that's when bingeing is likely to happen), a hunger pang, a craving, or the fact that it's 3:00 p.m. should not send you racing for the vending machine or obsessing about the energy bar in your purse.  Ideally, you should put off eating until your stomach is growling and it's difficult to concentrate.  Use it to lose it. When you feel the urge to eat, use the HALT method. Ask yourself, Am I really hungry? Or am I angry or anxious, lonely or bored, or tired? If you're still not certain, try the apple test. If you're truly hungry, an apple should seem delicious; if it doesn't, something else is going on. Or you can try drinking water and making yourself busy, if you are still hungry try a healthy snack.  4. Not All Calories Are Created Equal The mechanics of weight loss are pretty simple: Take in fewer calories than you use for energy. But the kind of food you eat makes all the difference. Processed food that's high in saturated fat and refined starch or sugar can cause inflammation that disrupts the hormone signals that tell your brain you're full. The result: You eat a lot more.  Use it to lose it. Clean up your diet. Swap in whole, unprocessed foods, including vegetables, lean protein, and healthy fats that will fill you up and give you the biggest nutritional bang for your calorie buck. In a few weeks, as your brain starts receiving regular hunger and fullness signals once again, you'll notice that you feel less hungry overall and naturally start cutting back on the amount you eat.  5. Protein, Produce, and Plant-Based Fats Are Your Weight-Loss Trinity Here's why eating the three Ps regularly will help you drop pounds. Protein fills you up. You need it to build lean muscle, which keeps your metabolism humming so that you can torch more fat. People in a weight-loss program who ate double the recommended daily allowance for protein (about 110 grams for a 150-pound woman) lost 70 percent of their weight from fat, while people who ate the RDA lost only about 40 percent, one study found. Produce is packed with filling fiber. "It's very difficult to consume too many calories if you're eating a lot of  vegetables.  Example: Three cups of broccoli is a lot of food, yet only 93 calories. (Fruit is another story. It can be easy to overeat and can contain a lot of calories from sugar, so be sure to monitor your intake.) Plant-based fats like olive oil and those in avocados and nuts are healthy and extra satiating.  Use it to lose it. Aim to incorporate each of the three Ps into every meal and snack. People who eat protein throughout the day are able to keep weight off, according to a study in the [employer]. In addition to meat, poultry and seafood, good sources are beans, lentils, eggs, tofu, and yogurt. As for fat, keep portion sizes in check by measuring out salad dressing, oil, and nut butters (shoot for one to two tablespoons). Finally, eat veggies or a little fruit at every meal. People who did that consumed 308 fewer calories but didn't feel any hungrier than when they didn't eat more produce.  7. How You Eat Is As Important As What You Eat In order for your brain to register that you're full, you need to focus on what you're eating. Sit down whenever you eat, preferably at a table. Turn off the TV or computer, put down your phone, and look at your food. Smell it. Chew slowly, and don't put another bite on your fork until you swallow. When women ate lunch this attentively, they consumed 30 percent less when snacking later than those who listened to an audiobook at lunchtime, according to a study in the [employer] of Nutrition. 8. Weighing Yourself Really Works The scale provides the best evidence about whether your efforts are paying off. Seeing the numbers tick up or down or stagnate is motivation to keep going-or to rethink your approach. A 2015 study at [facility] found that daily weigh-ins helped people lose more weight, keep it off, and maintain that loss, even after two years. Use it to lose it. Step on the scale at the same time every day for the best  results. If your weight shoots up several pounds from one weigh-in to the next, don't freak out. Eating a lot of salt the night before or having your period is the likely culprit. The number should return to normal in a day or two. It's a steady climb that you need to do something about. 9. Too Much Stress and Too Little Sleep Are Your Enemies When you're tired and frazzled, your body cranks up the production of cortisol, the stress hormone that can cause carb cravings. Not getting enough sleep also boosts your levels of ghrelin, a hormone associated with hunger, while suppressing leptin, a hormone that signals fullness and satiety. People on a diet who slept only five and a half hours a night for two weeks lost 55 percent less fat and were hungrier than those who slept eight and a half hours, according to a study in the [place]. Use it to lose it. Prioritize sleep, aiming for seven hours or more a night, which research shows helps lower stress. And make sure you're getting quality zzz's. If a snoring spouse or a fidgety cat wakes you up frequently throughout the night, you may end up getting the equivalent of just four hours of sleep, according to a study from [facility]. Keep pets out of the bedroom, and use a white-noise app to drown out snoring. 10. You Will Hit a plateau-And You Can Bust Through It As you slim  down, your body releases much less leptin, the fullness hormone.  If you're not strength training, start right now. Building muscle can raise your metabolism to help you overcome a plateau. To keep your body challenged and burning calories, incorporate new moves and more intense intervals into your workouts or add another sweat session to your weekly routine. Alternatively, cut an extra 100 calories or so a day from your diet. Now that you've lost weight, your body simply doesn't need as much fuel.

## 2015-05-28 LAB — LIPID PANEL
CHOL/HDL RATIO: 4.6 ratio (ref <=5.0)
Cholesterol: 175 mg/dL (ref 125–200)
HDL: 38 mg/dL — AB (ref >=46)
LDL CALC: 99 mg/dL (ref <130)
Triglycerides: 190 mg/dL — ABNORMAL HIGH (ref <150)
VLDL: 38 mg/dL — ABNORMAL HIGH (ref <30)

## 2015-05-28 LAB — HEPATIC FUNCTION PANEL
ALBUMIN: 3.6 g/dL (ref 3.6–5.1)
ALK PHOS: 63 U/L (ref 33–115)
ALT: 11 U/L (ref 6–29)
AST: 14 U/L (ref 10–30)
BILIRUBIN DIRECT: 0.1 mg/dL (ref <=0.2)
BILIRUBIN TOTAL: 0.3 mg/dL (ref 0.2–1.2)
Indirect Bilirubin: 0.2 mg/dL (ref 0.2–1.2)
Total Protein: 6.3 g/dL (ref 6.1–8.1)

## 2015-05-28 LAB — TSH: TSH: 2.96 u[IU]/mL (ref 0.350–4.500)

## 2015-05-28 LAB — BASIC METABOLIC PANEL WITH GFR
BUN: 8 mg/dL (ref 7–25)
CO2: 29 mmol/L (ref 20–31)
CREATININE: 0.57 mg/dL (ref 0.50–1.10)
Calcium: 8.9 mg/dL (ref 8.6–10.2)
Chloride: 105 mmol/L (ref 98–110)
GFR, Est African American: >89 mL/min (ref >=60)
GFR, Est Non African American: >89 mL/min (ref >=60)
Glucose, Bld: 65 mg/dL (ref 65–99)
POTASSIUM: 4.3 mmol/L (ref 3.5–5.3)
Sodium: 140 mmol/L (ref 135–146)

## 2015-05-28 LAB — VITAMIN D 25 HYDROXY (VIT D DEFICIENCY, FRACTURES): VIT D 25 HYDROXY: 19 ng/mL — AB (ref 30–100)

## 2015-05-28 LAB — INSULIN, FASTING: Insulin fasting, serum: 12.2 u[IU]/mL (ref 2.0–19.6)

## 2015-05-28 LAB — HEMOGLOBIN A1C
HEMOGLOBIN A1C: 6.5 % — AB (ref <5.7)
MEAN PLASMA GLUCOSE: 140 mg/dL — AB (ref <117)

## 2015-05-28 LAB — IRON AND TIBC
%SAT: 10 % — ABNORMAL LOW (ref 11–50)
Iron: 42 ug/dL (ref 40–190)
TIBC: 436 ug/dL (ref 250–450)
UIBC: 394 ug/dL (ref 125–400)

## 2015-05-28 LAB — FERRITIN: Ferritin: 7 ng/mL — ABNORMAL LOW (ref 10–291)

## 2015-05-28 LAB — MAGNESIUM: Magnesium: 1.8 mg/dL (ref 1.5–2.5)

## 2015-06-04 ENCOUNTER — Other Ambulatory Visit: Payer: Self-pay | Admitting: Physician Assistant

## 2015-06-04 ENCOUNTER — Other Ambulatory Visit: Payer: Self-pay | Admitting: *Deleted

## 2015-06-04 DIAGNOSIS — I10 Essential (primary) hypertension: Secondary | ICD-10-CM

## 2015-06-04 MED ORDER — LOSARTAN POTASSIUM-HCTZ 100-25 MG PO TABS: 1.0000 | ORAL_TABLET | Freq: Every day | ORAL | Status: DC

## 2015-06-05 ENCOUNTER — Encounter: Payer: Self-pay | Admitting: Physician Assistant

## 2015-06-05 ENCOUNTER — Other Ambulatory Visit: Payer: Self-pay | Admitting: Physician Assistant

## 2015-06-05 MED ORDER — PREDNISONE 20 MG PO TABS: ORAL_TABLET | ORAL | Status: DC

## 2015-06-28 ENCOUNTER — Other Ambulatory Visit: Payer: Self-pay | Admitting: Physician Assistant

## 2015-06-28 DIAGNOSIS — F988 Other specified behavioral and emotional disorders with onset usually occurring in childhood and adolescence: Secondary | ICD-10-CM

## 2015-06-28 MED ORDER — AMPHETAMINE-DEXTROAMPHETAMINE 20 MG PO TABS: ORAL_TABLET | ORAL | Status: DC

## 2015-06-28 NOTE — Addendum Note (Signed)
Addended by: Vladimir Crofts on: 06/28/2015 08:32 AM   Modules accepted: Orders

## 2015-07-15 ENCOUNTER — Other Ambulatory Visit: Payer: Self-pay | Admitting: Internal Medicine

## 2015-07-15 MED ORDER — OMEPRAZOLE 20 MG PO CPDR: 20.0000 mg | DELAYED_RELEASE_CAPSULE | Freq: Every day | ORAL | Status: DC

## 2015-07-16 ENCOUNTER — Other Ambulatory Visit: Payer: Self-pay | Admitting: Emergency Medicine

## 2015-07-25 ENCOUNTER — Other Ambulatory Visit: Payer: Self-pay | Admitting: Physician Assistant

## 2015-07-25 DIAGNOSIS — F988 Other specified behavioral and emotional disorders with onset usually occurring in childhood and adolescence: Secondary | ICD-10-CM

## 2015-07-25 MED ORDER — AMPHETAMINE-DEXTROAMPHETAMINE 20 MG PO TABS: ORAL_TABLET | ORAL | Status: DC

## 2015-07-25 NOTE — Addendum Note (Signed)
Addended by: Vicie Mutters R on: 07/25/2015 01:48 PM   Modules accepted: Orders

## 2015-07-26 ENCOUNTER — Other Ambulatory Visit: Payer: Self-pay | Admitting: Physician Assistant

## 2015-08-28 DIAGNOSIS — Z79899 Other long term (current) drug therapy: Secondary | ICD-10-CM | POA: Diagnosis not present

## 2015-08-28 DIAGNOSIS — E785 Hyperlipidemia, unspecified: Secondary | ICD-10-CM | POA: Diagnosis not present

## 2015-08-28 DIAGNOSIS — I10 Essential (primary) hypertension: Secondary | ICD-10-CM

## 2015-08-28 DIAGNOSIS — R7309 Other abnormal glucose: Secondary | ICD-10-CM | POA: Diagnosis not present

## 2015-08-29 ENCOUNTER — Encounter: Payer: Self-pay | Admitting: Physician Assistant

## 2015-08-29 ENCOUNTER — Ambulatory Visit (INDEPENDENT_AMBULATORY_CARE_PROVIDER_SITE_OTHER): Payer: PRIVATE HEALTH INSURANCE | Admitting: Physician Assistant

## 2015-08-29 VITALS — BP 140/80 | HR 82 | Temp 97.7°F | Resp 16 | Ht 68.5 in | Wt 297.8 lb

## 2015-08-29 DIAGNOSIS — E785 Hyperlipidemia, unspecified: Secondary | ICD-10-CM

## 2015-08-29 DIAGNOSIS — I10 Essential (primary) hypertension: Secondary | ICD-10-CM | POA: Diagnosis not present

## 2015-08-29 DIAGNOSIS — D649 Anemia, unspecified: Secondary | ICD-10-CM | POA: Diagnosis not present

## 2015-08-29 DIAGNOSIS — E559 Vitamin D deficiency, unspecified: Secondary | ICD-10-CM | POA: Diagnosis not present

## 2015-08-29 DIAGNOSIS — Z79899 Other long term (current) drug therapy: Secondary | ICD-10-CM | POA: Diagnosis not present

## 2015-08-29 DIAGNOSIS — F988 Other specified behavioral and emotional disorders with onset usually occurring in childhood and adolescence: Secondary | ICD-10-CM

## 2015-08-29 DIAGNOSIS — F909 Attention-deficit hyperactivity disorder, unspecified type: Secondary | ICD-10-CM

## 2015-08-29 DIAGNOSIS — R7309 Other abnormal glucose: Secondary | ICD-10-CM

## 2015-08-29 LAB — CBC WITH DIFFERENTIAL/PLATELET
Basophils Absolute: 0 cells/uL (ref 0–200)
Basophils Relative: 0 %
Eosinophils Absolute: 154 cells/uL (ref 15–500)
Eosinophils Relative: 2 %
HEMATOCRIT: 33.6 % — AB (ref 35.0–45.0)
Hemoglobin: 10.9 g/dL — ABNORMAL LOW (ref 11.7–15.5)
LYMPHS PCT: 29 %
Lymphs Abs: 2233 cells/uL (ref 850–3900)
MCH: 27.2 pg (ref 27.0–33.0)
MCHC: 32.4 g/dL (ref 32.0–36.0)
MCV: 83.8 fL (ref 80.0–100.0)
MONO ABS: 539 {cells}/uL (ref 200–950)
MONOS PCT: 7 %
MPV: 8.9 fL (ref 7.5–12.5)
NEUTROS PCT: 62 %
Neutro Abs: 4774 cells/uL (ref 1500–7800)
PLATELETS: 335 10*3/uL (ref 140–400)
RBC: 4.01 MIL/uL (ref 3.80–5.10)
RDW: 16.8 % — AB (ref 11.0–15.0)
WBC: 7.7 10*3/uL (ref 3.8–10.8)

## 2015-08-29 MED ORDER — AMPHETAMINE-DEXTROAMPHETAMINE 20 MG PO TABS: ORAL_TABLET | ORAL | Status: DC

## 2015-08-29 MED ORDER — FLUCONAZOLE 150 MG PO TABS
150.0000 mg | ORAL_TABLET | Freq: Every day | ORAL | Status: DC
Start: 2015-08-29 — End: 2016-06-25

## 2015-08-29 MED ORDER — FLUTICASONE PROPIONATE 50 MCG/ACT NA SUSP: 2.0000 | Freq: Every day | NASAL | Status: DC

## 2015-08-29 MED ORDER — CANAGLIFLOZIN 300 MG PO TABS: 300.0000 mg | ORAL_TABLET | Freq: Every day | ORAL | Status: DC

## 2015-08-29 NOTE — Patient Instructions (Addendum)
Diabetes is a very complicated disease...lets simplify it.  An easy way to look at it to understand the complications is if you think of the extra sugar floating in your blood stream as glass shards floating through your blood stream.    Diabetes affects your small vessels first: 1) The glass shards (sugar) scraps down the tiny blood vessels in your eyes and lead to diabetic retinopathy, the leading cause of blindness in the [place]. Diabetes is the leading cause of newly diagnosed adult (69 to 42 years of age) blindness in the [place].  2) The glass shards scratches down the tiny vessels of your legs leading to nerve damage called neuropathy and can lead to amputations of your feet. More than 60% of all non-traumatic amputations of lower limbs occur in people with diabetes.  3) Over time the small vessels in your brain are shredded and closed off, individually this does not cause any problems but over a long period of time many of the small vessels being blocked can lead to Vascular Dementia.   4) Your kidney's are a filter system and have a "net" that keeps certain things in the body and lets bad things out. Sugar shreds this net and leads to kidney damage and eventually failure. Decreasing the sugar that is destroying the net and certain blood pressure medications can help stop or decrease progression of kidney disease. Diabetes was the primary cause of kidney failure in 44 percent of all new cases in 2011.  5) Diabetes also destroys the small vessels in your penis that lead to erectile dysfunction. Eventually the vessels are so damaged that you may not be responsive to cialis or viagra.   Diabetes and your large vessels: Your larger vessels consist of your coronary arteries in your heart and the carotid vessels to your brain. Diabetes or even increased sugars put you at 300% increased risk of heart attack and stroke and this is why.. The sugar scrapes down your large blood vessels and your body  sees this as an internal injury and tries to repair itself. Just like you get a scab on your skin, your platelets will stick to the blood vessel wall trying to heal it. This is why we have diabetics on low dose aspirin daily, this prevents the platelets from sticking and can prevent plaque formation. In addition, your body takes cholesterol and tries to shove it into the open wound. This is why we want your LDL, or bad cholesterol, below 70.   The combination of platelets and cholesterol over 5-10 years forms plaque that can break off and cause a heart attack or stroke.   PLEASE REMEMBER:  Diabetes is preventable! Up to 39 percent of complications and morbidities among individuals with type 2 diabetes can be prevented, delayed, or effectively treated and minimized with regular visits to a health professional, appropriate monitoring and medication, and a healthy diet and lifestyle.  Please start 100 of Invokana for 1 week then go up to 300mg  of Invokana. This can decreases your blood pressure so please contact us if you have any dizziness, sometime we need to decrease or stop fluid pills or decrease BP meds. You are peeing out 300-400 calories a day of sugar which can lead to yeast infections, you can take the diflucan as needed but if the infections continue we will stop the medications. We will have you follow up in 1 month to check your kidney function and weight. Call if you need anything.   We  want weight loss that will last so you should lose 1-2 pounds a week.  THAT IS IT! Please pick THREE things a month to change. Once it is a habit check off the item. Then pick another three items off the list to become habits.  If you are already doing a habit on the list GREAT!  Cross that item off! o Don't drink your calories. Ie, alcohol, soda, fruit juice, and sweet tea.  o Drink more water. Drink a glass when you feel hungry or before each meal.  o Eat breakfast - Complex carb and protein (likeDannon light  and fit yogurt, oatmeal, fruit, eggs, [place] bacon). o Measure your cereal.  Eat no more than one cup a day. (ie [place]) o Eat an apple a day. o Add a vegetable a day. o Try a new vegetable a month. o Use Pam! Stop using oil or butter to cook. o Don't finish your plate or use smaller plates. o Share your dessert. o Eat sugar free Jello for dessert or frozen grapes. o Don't eat 2-3 hours before bed. o Switch to whole wheat bread, pasta, and brown rice. o Make healthier choices when you eat out. No fries! o Pick baked chicken, NOT fried. o Don't forget to SLOW DOWN when you eat. It is not going anywhere.  o Take the stairs. o Park far away in the parking lot o [employer] (or weights) for 10 minutes while watching TV. o Walk at work for 10 minutes during break. o Walk outside 1 time a week with your friend, kids, dog, or significant other. o Start a walking group at [place] the mall as much as you can tolerate.  o Keep a food diary. o Weigh yourself daily. o Walk for 15 minutes 3 days per week. o Cook at home more often and eat out less.  If life happens and you go back to old habits, it is okay.  Just start over. You can do it!   If you experience chest pain, get short of breath, or tired during the exercise, please stop immediately and inform your doctor.   Before you even begin to attack a weight-loss plan, it pays to remember this: You are not fat. You have fat. Losing weight isn't about blame or shame; it's simply another achievement to accomplish. Dieting is like any other skill-you have to buckle down and work at it. As long as you act in a smart, reasonable way, you'll ultimately get where you want to be. Here are some weight loss pearls for you.  1. It's Not a Diet. It's a Lifestyle Thinking of a diet as something you're on and suffering through only for the short term doesn't work. To shed weight and keep it off, you need to make permanent changes to the way you eat.  It's OK to indulge occasionally, of course, but if you cut calories temporarily and then revert to your old way of eating, you'll gain back the weight quicker than you can say yo-yo. Use it to lose it. Research shows that one of the best predictors of long-term weight loss is how many pounds you drop in the first month. For that reason, nutritionists often suggest being stricter for the first two weeks of your new eating strategy to build momentum. Cut out added sugar and alcohol and avoid unrefined carbs. After that, figure out how you can reincorporate them in a way that's healthy and maintainable.  2. There's a Right  Way to Exercise Working out burns calories and fat and boosts your metabolism by building muscle. But those trying to lose weight are notorious for overestimating the number of calories they burn and underestimating the amount they take in. Unfortunately, your system is biologically programmed to hold on to extra pounds and that means when you start exercising, your body senses the deficit and ramps up its hunger signals. If you're not diligent, you'll eat everything you burn and then some. Use it to lose it. Cardio gets all the exercise glory, but strength and interval training are the real heroes. They help you build lean muscle, which in turn increases your metabolism and calorie-burning ability 3. Don't Overreact to Mild Hunger Some people have a hard time losing weight because of hunger anxiety. To them, being hungry is bad-something to be avoided at all costs-so they carry snacks with them and eat when they don't need to. Others eat because they're stressed out or bored. While you never want to get to the point of being ravenous (that's when bingeing is likely to happen), a hunger pang, a craving, or the fact that it's 3:00 p.m. should not send you racing for the vending machine or obsessing about the energy bar in your purse. Ideally, you should put off eating until your stomach is  growling and it's difficult to concentrate.  Use it to lose it. When you feel the urge to eat, use the HALT method. Ask yourself, Am I really hungry? Or am I angry or anxious, lonely or bored, or tired? If you're still not certain, try the apple test. If you're truly hungry, an apple should seem delicious; if it doesn't, something else is going on. Or you can try drinking water and making yourself busy, if you are still hungry try a healthy snack.  4. Not All Calories Are Created Equal The mechanics of weight loss are pretty simple: Take in fewer calories than you use for energy. But the kind of food you eat makes all the difference. Processed food that's high in saturated fat and refined starch or sugar can cause inflammation that disrupts the hormone signals that tell your brain you're full. The result: You eat a lot more.  Use it to lose it. Clean up your diet. Swap in whole, unprocessed foods, including vegetables, lean protein, and healthy fats that will fill you up and give you the biggest nutritional bang for your calorie buck. In a few weeks, as your brain starts receiving regular hunger and fullness signals once again, you'll notice that you feel less hungry overall and naturally start cutting back on the amount you eat.  5. Protein, Produce, and Plant-Based Fats Are Your Weight-Loss Trinity Here's why eating the three Ps regularly will help you drop pounds. Protein fills you up. You need it to build lean muscle, which keeps your metabolism humming so that you can torch more fat. People in a weight-loss program who ate double the recommended daily allowance for protein (about 110 grams for a 150-pound woman) lost 70 percent of their weight from fat, while people who ate the RDA lost only about 40 percent, one study found. Produce is packed with filling fiber. "It's very difficult to consume too many calories if you're eating a lot of vegetables. Example: Three cups of broccoli is a lot of food, yet  only 93 calories. (Fruit is another story. It can be easy to overeat and can contain a lot of calories from sugar, so be sure to  monitor your intake.) Plant-based fats like olive oil and those in avocados and nuts are healthy and extra satiating.  Use it to lose it. Aim to incorporate each of the three Ps into every meal and snack. People who eat protein throughout the day are able to keep weight off, according to a study in the [place] of Clinical Nutrition. In addition to meat, poultry and seafood, good sources are beans, lentils, eggs, tofu, and yogurt. As for fat, keep portion sizes in check by measuring out salad dressing, oil, and nut butters (shoot for one to two tablespoons). Finally, eat veggies or a little fruit at every meal. People who did that consumed 308 fewer calories but didn't feel any hungrier than when they didn't eat more produce.  7. How You Eat Is As Important As What You Eat In order for your brain to register that you're full, you need to focus on what you're eating. Sit down whenever you eat, preferably at a table. Turn off the TV or computer, put down your phone, and look at your food. Smell it. Chew slowly, and don't put another bite on your fork until you swallow. When women ate lunch this attentively, they consumed 30 percent less when snacking later than those who listened to an audiobook at lunchtime, according to a study in the [place] of Nutrition. 8. Weighing Yourself Really Works The scale provides the best evidence about whether your efforts are paying off. Seeing the numbers tick up or down or stagnate is motivation to keep going-or to rethink your approach. A 2015 study at [facility] found that daily weigh-ins helped people lose more weight, keep it off, and maintain that loss, even after two years. Use it to lose it. Step on the scale at the same time every day for the best results. If your weight shoots up several pounds from one weigh-in to  the next, don't freak out. Eating a lot of salt the night before or having your period is the likely culprit. The number should return to normal in a day or two. It's a steady climb that you need to do something about. 9. Too Much Stress and Too Little Sleep Are Your Enemies When you're tired and frazzled, your body cranks up the production of cortisol, the stress hormone that can cause carb cravings. Not getting enough sleep also boosts your levels of ghrelin, a hormone associated with hunger, while suppressing leptin, a hormone that signals fullness and satiety. People on a diet who slept only five and a half hours a night for two weeks lost 55 percent less fat and were hungrier than those who slept eight and a half hours, according to a study in the [place]. Use it to lose it. Prioritize sleep, aiming for seven hours or more a night, which research shows helps lower stress. And make sure you're getting quality zzz's. If a snoring spouse or a fidgety cat wakes you up frequently throughout the night, you may end up getting the equivalent of just four hours of sleep, according to a study from [facility]. Keep pets out of the bedroom, and use a white-noise app to drown out snoring. 10. You Will Hit a plateau-And You Can Bust Through It As you slim down, your body releases much less leptin, the fullness hormone.  If you're not strength training, start right now. Building muscle can raise your metabolism to help you overcome a plateau. To keep your body challenged and  burning calories, incorporate new moves and more intense intervals into your workouts or add another sweat session to your weekly routine. Alternatively, cut an extra 100 calories or so a day from your diet. Now that you've lost weight, your body simply doesn't need as much fuel.   Your ears and sinuses are connected by the eustachian tube. When your sinuses are inflamed, this can close off the tube and cause  fluid to collect in your middle ear. This can then cause dizziness, popping, clicking, ringing, and echoing in your ears. This is often NOT an infection and does NOT require antibiotics, it is caused by inflammation so the treatments help the inflammation. This can take a long time to get better so please be patient.  Here are things you can do to help with this: - Try the Flonase or Nasonex. Remember to spray each nostril twice towards the outer part of your eye.  Do not sniff but instead pinch your nose and tilt your head back to help the medicine get into your sinuses.  The best time to do this is at bedtime.Stop if you get blurred vision or nose bleeds.  -While drinking fluids, pinch and hold nose close and swallow, to help open eustachian tubes to drain fluid behind ear drums. -Please pick one of the over the counter allergy medications below and take it once daily for allergies.  It will also help with fluid behind ear drums. Claritin or loratadine cheapest but likely the weakest  Zyrtec or certizine at night because it can make you sleepy The strongest is allegra or fexafinadine  Cheapest at [facility], [employer], [employer] -can use decongestant over the counter, please do not use if you have high blood pressure or certain heart conditions.   if worsening HA, changes vision/speech, imbalance, weakness go to the ER

## 2015-08-29 NOTE — Progress Notes (Signed)
Assessment and Plan:  1. Hypertension -Continue medication, monitor blood pressure at home. Continue DASH diet.  Reminder to go to the ER if any CP, SOB, nausea, dizziness, severe HA, changes vision/speech, left arm numbness and tingling and jaw pain.  2. Cholesterol -Continue diet and exercise. Check cholesterol.   3. Prediabetes --> DM range - start invokana -Continue diet and exercise. Check A1C  4. Vitamin D Def - check level and continue medications.   5. Asthma, moderate persistent, with acute exacerbation - montelukast (SINGULAIR) 10 MG tablet; Take 1 tablet (10 mg total) by mouth daily.  Dispense: 30 tablet; Refill: 2  6. Anemia, unspecified anemia type - Ferritin - Iron and TIBC  7. Morbid obesity, unspecified obesity type ([place]) Obesity with co morbidities- long discussion about weight loss, diet, and exercise  8. ADD (attention deficit disorder) - amphetamine-dextroamphetamine (ADDERALL) 20 MG tablet; Take 1/2 to 1 tablet 2 x day if needed for ADD  Dispense: 60 tablet; Refill: 0   Continue diet and meds as discussed. Further disposition pending results of labs. Over 30 minutes of exam, counseling, chart review, and critical decision making was performed  HPI 40 y.o. female  presents for 3 month follow up on hypertension, cholesterol, prediabetes, and vitamin D deficiency.   Her blood pressure has been controlled at home, today their BP is BP: 140/80 mmHg  She does not workout. She denies chest pain, shortness of breath, dizziness.  She is not on cholesterol medication and denies myalgias. Her cholesterol is at goal. The cholesterol last visit was:   Lab Results  Component Value Date   CHOL 175 05/27/2015   HDL 38* 05/27/2015   LDLCALC 99 05/27/2015   TRIG 190* 05/27/2015   CHOLHDL 4.6 05/27/2015    She has been working on diet and exercise for prediabetes, she is off metformin due to stomach pain/diarrhea, and denies paresthesia of the feet, polydipsia, polyuria  and visual disturbances. Last A1C in the office was:  Lab Results  Component Value Date   HGBA1C 6.5* 05/27/2015   Patient is on Vitamin D supplement.   Lab Results  Component Value Date   VD25OH 19* 05/27/2015     She had had some vertigo since Sept on and off, normal CT scan, had epley manuver that helped.  She is on Adderall 1/2-1 tablet daily.  BMI is Body mass index is 44.62 kg/(m^2)., she is working on diet and exercise. Wt Readings from Last 3 Encounters:  08/29/15 297 lb 12.8 oz (135.081 kg)  05/27/15 298 lb (135.172 kg)  04/01/15 290 lb (131.543 kg)    Current Medications:  Current Outpatient Prescriptions on File Prior to Visit  Medication Sig Dispense Refill  . Cetirizine HCl (ZYRTEC ALLERGY) 10 MG CAPS Take 1 capsule (10 mg total) by mouth daily. 90 capsule 1  . ibuprofen (ADVIL,MOTRIN) 600 MG tablet Take 1 tablet (600 mg total) by mouth every 6 (six) hours. 30 tablet 0  . losartan-hydrochlorothiazide (HYZAAR) 100-25 MG tablet Take 1 tablet by mouth daily. 90 tablet 1  . meclizine (ANTIVERT) 50 MG tablet Take 0.5 tablets (25 mg total) by mouth 3 (three) times daily as needed for dizziness or nausea. 60 tablet 1  . montelukast (SINGULAIR) 10 MG tablet Take 1 tablet (10 mg total) by mouth daily. 30 tablet 2  . omeprazole (PRILOSEC) 20 MG capsule Take 1 capsule (20 mg total) by mouth daily. 90 capsule 0  . ondansetron (ZOFRAN) 4 MG tablet Take 1 tablet (4 mg  total) by mouth every 8 (eight) hours as needed for nausea or vomiting. 30 tablet 1  . sertraline (ZOLOFT) 100 MG tablet TAKE 1 TABLET BY MOUTH EVERY DAY 90 tablet 0  . VENTOLIN HFA 108 (90 Base) MCG/ACT inhaler INHALE 1 PUFF INTO LUNGS EVERY 6 HOURS AS NEEDED FOR WEEZING OR SHORTNESS OF BREATH 18 g 1  . metFORMIN (GLUCOPHAGE XR) 500 MG 24 hr tablet Take 1 tablet (500 mg total) by mouth daily with breakfast. 90 tablet 1   No current facility-administered medications on file prior to visit.   Medical History:  Past  Medical History  Diagnosis Date  . Short cervix affecting pregnancy 04/20/2013  . Cervical cerclage suture present 04/21/2013  . Fibroid   . Abnormal Pap smear     bx x2; LEEP  . Mental disorder     mild bipolar  . Hypertension     no meds   . Gestational diabetes mellitus, antepartum     GDM  . Anxiety     no meds  . Asthma   . GERD (gastroesophageal reflux disease)   . ADD (attention deficit disorder)   . Hyperlipidemia   . PCOS (polycystic ovarian syndrome)   . IBS (irritable bowel syndrome)    Allergies:  Allergies  Allergen Reactions  . Lisinopril Shortness Of Breath and Cough    Breathing problems   . Bisoprolol     Chest tightness   . Latex Rash  . Mobic [Meloxicam] Itching     Review of Systems:  Review of Systems  Constitutional: Positive for malaise/fatigue. Negative for fever and chills.  HENT: Positive for congestion and ear pain. Negative for sore throat.   Respiratory: Negative for cough, sputum production, shortness of breath and wheezing.   Cardiovascular: Negative for chest pain, palpitations and leg swelling.  Gastrointestinal: Negative.   Genitourinary: Negative.   Musculoskeletal: Negative.   Neurological: Negative for tingling, tremors, sensory change, speech change, focal weakness, seizures and loss of consciousness. Dizziness:  vertigo.  Psychiatric/Behavioral: Negative.    Family history- Review and unchanged Social history- Review and unchanged Physical Exam: BP 140/80 mmHg  Pulse 82  Temp(Src) 97.7 F (36.5 C) (Temporal)  Resp 16  Ht 5' 8.5" (1.74 m)  Wt 297 lb 12.8 oz (135.081 kg)  BMI 44.62 kg/m2  SpO2 97%  LMP 08/17/2015 Wt Readings from Last 3 Encounters:  08/29/15 297 lb 12.8 oz (135.081 kg)  05/27/15 298 lb (135.172 kg)  04/01/15 290 lb (131.543 kg)   General Appearance: Well nourished, in no apparent distress. Eyes: PERRLA, EOMs, conjunctiva no swelling or erythema Sinuses: + Frontal/maxillary tenderness ENT/Mouth:  Ext aud canals clear, TMs without erythema, bulging. No erythema, swelling, or exudate on post pharynx.  Tonsils not swollen or erythematous. Hearing normal.  Neck: Supple, thyroid normal.  Respiratory: Respiratory effort normal, BS equal bilaterally without rales, rhonchi, wheezing or stridor.  Cardio: RRR with no MRGs. Brisk peripheral pulses without edema.  Abdomen: Soft, + BS,  Non tender, no guarding, rebound, hernias, masses. Lymphatics: Non tender without lymphadenopathy.  Musculoskeletal: Full ROM, 5/5 strength, Normal gait Skin: Warm, dry without rashes, lesions, ecchymosis.  Neuro: Cranial nerves intact. Normal muscle tone, no cerebellar symptoms. Psych: Awake and oriented X 3, normal affect, Insight and Judgment appropriate.    Vicie Mutters, PA-C 4:54 PM [facility]

## 2015-08-30 LAB — HEPATIC FUNCTION PANEL
ALT: 15 U/L (ref 6–29)
AST: 15 U/L (ref 10–30)
Albumin: 3.8 g/dL (ref 3.6–5.1)
Alkaline Phosphatase: 76 U/L (ref 33–115)
Bilirubin, Direct: 0.1 mg/dL
Indirect Bilirubin: 0.2 mg/dL (ref 0.2–1.2)
Total Bilirubin: 0.3 mg/dL (ref 0.2–1.2)
Total Protein: 6.8 g/dL (ref 6.1–8.1)

## 2015-08-30 LAB — BASIC METABOLIC PANEL WITHOUT GFR
BUN: 11 mg/dL (ref 7–25)
CO2: 27 mmol/L (ref 20–31)
Calcium: 8.8 mg/dL (ref 8.6–10.2)
Chloride: 103 mmol/L (ref 98–110)
Creat: 0.73 mg/dL (ref 0.50–1.10)
GFR, Est African American: >89 mL/min
GFR, Est Non African American: >89 mL/min
Glucose, Bld: 85 mg/dL (ref 65–99)
Potassium: 4.4 mmol/L (ref 3.5–5.3)
Sodium: 139 mmol/L (ref 135–146)

## 2015-08-30 LAB — LIPID PANEL
CHOLESTEROL: 191 mg/dL (ref 125–200)
HDL: 40 mg/dL — ABNORMAL LOW (ref >=46)
LDL Cholesterol: 126 mg/dL (ref <130)
TRIGLYCERIDES: 127 mg/dL (ref <150)
Total CHOL/HDL Ratio: 4.8 Ratio (ref <=5.0)
VLDL: 25 mg/dL (ref <30)

## 2015-08-30 LAB — TSH: TSH: 2.69 m[IU]/L

## 2015-08-30 LAB — MAGNESIUM: Magnesium: 1.7 mg/dL (ref 1.5–2.5)

## 2015-08-30 LAB — HEMOGLOBIN A1C
Hgb A1c MFr Bld: 6.2 % — ABNORMAL HIGH (ref <5.7)
Mean Plasma Glucose: 131 mg/dL

## 2015-09-04 ENCOUNTER — Encounter: Payer: Self-pay | Admitting: Physician Assistant

## 2015-09-30 ENCOUNTER — Encounter: Payer: Self-pay | Admitting: Physician Assistant

## 2015-09-30 ENCOUNTER — Ambulatory Visit (INDEPENDENT_AMBULATORY_CARE_PROVIDER_SITE_OTHER): Payer: PRIVATE HEALTH INSURANCE | Admitting: Physician Assistant

## 2015-09-30 VITALS — BP 120/80 | HR 63 | Temp 97.3°F | Resp 16 | Ht 68.5 in | Wt 295.2 lb

## 2015-09-30 DIAGNOSIS — M79641 Pain in right hand: Secondary | ICD-10-CM

## 2015-09-30 DIAGNOSIS — F909 Attention-deficit hyperactivity disorder, unspecified type: Secondary | ICD-10-CM

## 2015-09-30 DIAGNOSIS — D649 Anemia, unspecified: Secondary | ICD-10-CM | POA: Diagnosis not present

## 2015-09-30 DIAGNOSIS — M79642 Pain in left hand: Secondary | ICD-10-CM | POA: Diagnosis not present

## 2015-09-30 DIAGNOSIS — F988 Other specified behavioral and emotional disorders with onset usually occurring in childhood and adolescence: Secondary | ICD-10-CM

## 2015-09-30 MED ORDER — MELOXICAM 15 MG PO TABS: ORAL_TABLET | ORAL | Status: DC

## 2015-09-30 MED ORDER — AMPHETAMINE-DEXTROAMPHETAMINE 20 MG PO TABS: ORAL_TABLET | ORAL | Status: DC

## 2015-09-30 NOTE — Patient Instructions (Addendum)
Get on cinammon 1000mg  BId  Varicose Veins Varicose veins are veins that have become enlarged and twisted. CAUSES This condition is the result of valves in the veins not working properly. Valves in the veins help return blood from the leg to the heart. When your calf muscles squeeze, the blood moves up your leg then the valves close and this continues until the blood gets back to your heart.  If these valves are damaged, blood flows backwards and backs up into the veins in the leg near the skin OR if your are sitting/standing for a long time without using your calf muscles the blood will back up into the veins in your legs. This causes the veins to become larger. People who are on their feet a lot, sit a lot without walking (like on a plane, at a desk, or in a car), who are pregnant, or who are overweight are more likely to develop varicose veins. SYMPTOMS   Bulging, twisted-appearing, bluish veins, most commonly found on the legs.  Leg pain or a feeling of heaviness. These symptoms may be worse at the end of the day.  Leg swelling.  Skin color changes. DIAGNOSIS  Varicose veins can usually be diagnosed with an exam of your legs by your caregiver. He or she may recommend an ultrasound of your leg veins. TREATMENT  Most varicose veins can be treated at home.However, other treatments are available for people who have persistent symptoms or who want to treat the cosmetic appearance of the varicose veins. But this is only cosmetic and they will return if not properly treated. These include:  Laser treatment of very small varicose veins.  Medicine that is shot (injected) into the vein. This medicine hardens the walls of the vein and closes off the vein. This treatment is called sclerotherapy. Afterwards, you may need to wear clothing or bandages that apply pressure.  Surgery. HOME CARE INSTRUCTIONS   Do not stand or sit in one position for long periods of time. Do not sit with your legs  crossed. Rest with your legs raised during the day.  Your legs have to be higher than your heart so that gravity will force the valves to open, so please really elevate your legs.   Wear elastic stockings or support hose. Do not wear other tight, encircling garments around the legs, pelvis, or waist.  ELASTIC THERAPY  has a wide variety of well priced compression stockings. [place], [place] [id number] [id number] [place] ARE COPPER INFUSED COMPRESSION SOCKS AT [facility] OR [facility]  Walk as much as possible to increase blood flow.  Raise the foot of your bed at night with 2-inch blocks.  If you get a cut in the skin over the vein and the vein bleeds, lie down with your leg raised and press on it with a clean cloth until the bleeding stops. Then place a bandage (dressing) on the cut. See your caregiver if it continues to bleed or needs stitches. SEEK MEDICAL CARE IF:   The skin around your ankle starts to break down.  You have pain, redness, tenderness, or hard swelling developing in your leg over a vein.  You are uncomfortable due to leg pain. Document Released: 02/04/2005 Document Revised: 07/20/2011 Document Reviewed: 06/23/2010 [facility] Patient Information 2014 [place].

## 2015-09-30 NOTE — Progress Notes (Signed)
Diabetes Education and Follow-Up Visit  40 y.o.female presents for diabetic education. Patient denies paresthesia of the feet, polydipsia, polyuria and visual disturbances. The patient is on an ACE/ARB, she is on a low dose aspirin at this time. She moved into a new apartment. She states that she has bilateral hand and feet swelling, knee pain, shoulder and elbow, and can take a few hours, NSAIDS help and hot showers. Has family history of lupus with her mother and RA with mom's sister.    Wt Readings from Last 3 Encounters:  09/30/15 295 lb 3.2 oz (133.902 kg)  08/29/15 297 lb 12.8 oz (135.081 kg)  05/27/15 298 lb (135.172 kg)   ROS: no polyuria or polydipsia, no chest pain, dyspnea or TIA's, no numbness, tingling or pain in extremities  Physical Exam: Blood pressure 120/80, pulse 63, temperature 97.3 F (36.3 C), temperature source Temporal, resp. rate 16, height 5' 8.5" (1.74 m), weight 295 lb 3.2 oz (133.902 kg), last menstrual period 09/06/2015, SpO2 97 %, not currently breastfeeding. Body mass index is 44.23 kg/(m^2). General Appearance:  alert, oriented, no acute distress, well nourished and obese fundi normal, heart sounds regular rate and rhythm, S1, S2 normal, no murmur, click, rub or gallop, chest clear, no hepatosplenomegaly  Labs: Lab Results  Component Value Date   HGBA1C 6.2* 08/29/2015    Lab Results  Component Value Date   MICROALBUR 1.4 07/03/2014    Lab Results  Component Value Date   CHOL 191 08/29/2015   HDL 40* 08/29/2015   LDLCALC 126 08/29/2015   TRIG 127 08/29/2015   CHOLHDL 4.8 08/29/2015     Plan and Assessment: Diabetes Mellitus type 2:  Diabetes mellitus Type II, under good control. Education: Reviewed 'ABCs' of diabetes management (respective goals in parentheses):  A1C (<7), blood pressure (<130/80), and cholesterol (LDL <100) Eye Exam yearly and Dental Exam every 6 months. Dietary recommendations Physical Activity recommendations  Obesity  with co morbidities - long discussion about weight loss, diet, and exercise  Bilateral joint pain, fatigue Family history of RA/lupus, check labs, mobic, weight loss advised.

## 2015-10-01 LAB — RHEUMATOID FACTOR

## 2015-10-01 LAB — CK: Total CK: 176 U/L (ref 7–177)

## 2015-10-01 LAB — ANTI-DNA ANTIBODY, DOUBLE-STRANDED: ds DNA Ab: 1 IU/mL

## 2015-10-01 LAB — ANA: Anti Nuclear Antibody(ANA): NEGATIVE

## 2015-10-01 LAB — C3 AND C4
C3 COMPLEMENT: 155 mg/dL (ref 90–180)
C4 Complement: 30 mg/dL (ref 16–47)

## 2015-10-01 LAB — CYCLIC CITRUL PEPTIDE ANTIBODY, IGG: Cyclic Citrullin Peptide Ab: <16 Units

## 2015-10-01 LAB — C-REACTIVE PROTEIN: CRP: 1.3 mg/dL — ABNORMAL HIGH (ref <0.60)

## 2015-10-01 LAB — ANTI-SMITH ANTIBODY: ENA SM AB SER-ACNC: NEGATIVE

## 2015-10-01 LAB — SEDIMENTATION RATE: SED RATE: 36 mm/h — AB (ref 0–20)

## 2015-10-02 ENCOUNTER — Encounter: Payer: Self-pay | Admitting: Physician Assistant

## 2015-10-03 LAB — COMPLEMENT, TOTAL

## 2015-10-10 ENCOUNTER — Other Ambulatory Visit: Payer: Self-pay | Admitting: Internal Medicine

## 2015-10-10 MED ORDER — OMEPRAZOLE 20 MG PO CPDR
20.0000 mg | DELAYED_RELEASE_CAPSULE | Freq: Every day | ORAL | Status: DC
Start: 2015-10-10 — End: 2016-06-20

## 2015-10-28 ENCOUNTER — Other Ambulatory Visit: Payer: Self-pay | Admitting: Physician Assistant

## 2015-10-28 DIAGNOSIS — F988 Other specified behavioral and emotional disorders with onset usually occurring in childhood and adolescence: Secondary | ICD-10-CM

## 2015-10-28 MED ORDER — AMPHETAMINE-DEXTROAMPHETAMINE 20 MG PO TABS: ORAL_TABLET | ORAL | Status: DC

## 2015-10-29 ENCOUNTER — Encounter: Payer: Self-pay | Admitting: Physician Assistant

## 2015-11-04 ENCOUNTER — Encounter: Payer: Self-pay | Admitting: Physician Assistant

## 2015-11-22 DIAGNOSIS — R42 Dizziness and giddiness: Secondary | ICD-10-CM | POA: Insufficient documentation

## 2015-11-26 ENCOUNTER — Encounter: Payer: Self-pay | Admitting: Physician Assistant

## 2015-12-02 ENCOUNTER — Ambulatory Visit (INDEPENDENT_AMBULATORY_CARE_PROVIDER_SITE_OTHER): Payer: PRIVATE HEALTH INSURANCE | Admitting: Physician Assistant

## 2015-12-02 ENCOUNTER — Encounter: Payer: Self-pay | Admitting: Physician Assistant

## 2015-12-02 VITALS — BP 130/90 | HR 96 | Temp 97.7°F | Resp 16 | Ht 68.5 in | Wt 297.0 lb

## 2015-12-02 DIAGNOSIS — R7309 Other abnormal glucose: Secondary | ICD-10-CM | POA: Diagnosis not present

## 2015-12-02 DIAGNOSIS — E785 Hyperlipidemia, unspecified: Secondary | ICD-10-CM

## 2015-12-02 DIAGNOSIS — E559 Vitamin D deficiency, unspecified: Secondary | ICD-10-CM

## 2015-12-02 DIAGNOSIS — I10 Essential (primary) hypertension: Secondary | ICD-10-CM | POA: Diagnosis not present

## 2015-12-02 DIAGNOSIS — Z79899 Other long term (current) drug therapy: Secondary | ICD-10-CM | POA: Diagnosis not present

## 2015-12-02 DIAGNOSIS — R42 Dizziness and giddiness: Secondary | ICD-10-CM

## 2015-12-02 DIAGNOSIS — F988 Other specified behavioral and emotional disorders with onset usually occurring in childhood and adolescence: Secondary | ICD-10-CM

## 2015-12-02 DIAGNOSIS — F909 Attention-deficit hyperactivity disorder, unspecified type: Secondary | ICD-10-CM

## 2015-12-02 DIAGNOSIS — J4541 Moderate persistent asthma with (acute) exacerbation: Secondary | ICD-10-CM

## 2015-12-02 LAB — CBC WITH DIFFERENTIAL/PLATELET
Basophils Absolute: 0 cells/uL (ref 0–200)
Basophils Relative: 0 %
EOS PCT: 2 %
Eosinophils Absolute: 144 cells/uL (ref 15–500)
HCT: 31.8 % — ABNORMAL LOW (ref 35.0–45.0)
HEMOGLOBIN: 10.2 g/dL — AB (ref 11.7–15.5)
LYMPHS ABS: 2088 {cells}/uL (ref 850–3900)
Lymphocytes Relative: 29 %
MCH: 26.8 pg — ABNORMAL LOW (ref 27.0–33.0)
MCHC: 32.1 g/dL (ref 32.0–36.0)
MCV: 83.7 fL (ref 80.0–100.0)
MPV: 9.4 fL (ref 7.5–12.5)
Monocytes Absolute: 648 cells/uL (ref 200–950)
Monocytes Relative: 9 %
NEUTROS ABS: 4320 {cells}/uL (ref 1500–7800)
Neutrophils Relative %: 60 %
Platelets: 346 10*3/uL (ref 140–400)
RBC: 3.8 MIL/uL (ref 3.80–5.10)
RDW: 15 % (ref 11.0–15.0)
WBC: 7.2 10*3/uL (ref 3.8–10.8)

## 2015-12-02 MED ORDER — AMPHETAMINE-DEXTROAMPHETAMINE 20 MG PO TABS
ORAL_TABLET | ORAL | 0 refills | Status: DC
Start: 2015-12-02 — End: 2016-01-15

## 2015-12-02 MED ORDER — AMITRIPTYLINE HCL 25 MG PO TABS: ORAL_TABLET | ORAL | 0 refills | Status: DC
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
## 2015-12-02 NOTE — Patient Instructions (Signed)
Varicose Veins Varicose veins are veins that have become enlarged and twisted. CAUSES This condition is the result of valves in the veins not working properly. Valves in the veins help return blood from the leg to the heart. When your calf muscles squeeze, the blood moves up your leg then the valves close and this continues until the blood gets back to your heart.  If these valves are damaged, blood flows backwards and backs up into the veins in the leg near the skin OR if your are sitting/standing for a long time without using your calf muscles the blood will back up into the veins in your legs. This causes the veins to become larger. People who are on their feet a lot, sit a lot without walking (like on a plane, at a desk, or in a car), who are pregnant, or who are overweight are more likely to develop varicose veins. SYMPTOMS   Bulging, twisted-appearing, bluish veins, most commonly found on the legs.  Leg pain or a feeling of heaviness. These symptoms may be worse at the end of the day.  Leg swelling.  Skin color changes. DIAGNOSIS  Varicose veins can usually be diagnosed with an exam of your legs by your caregiver. He or she may recommend an ultrasound of your leg veins. TREATMENT  Most varicose veins can be treated at home.However, other treatments are available for people who have persistent symptoms or who want to treat the cosmetic appearance of the varicose veins. But this is only cosmetic and they will return if not properly treated. These include:  Laser treatment of very small varicose veins.  Medicine that is shot (injected) into the vein. This medicine hardens the walls of the vein and closes off the vein. This treatment is called sclerotherapy. Afterwards, you may need to wear clothing or bandages that apply pressure.  Surgery. HOME CARE INSTRUCTIONS   Do not stand or sit in one position for long periods of time. Do not sit with your legs crossed. Rest with your legs raised  during the day.  Your legs have to be higher than your heart so that gravity will force the valves to open, so please really elevate your legs.   Wear elastic stockings or support hose. Do not wear other tight, encircling garments around the legs, pelvis, or waist.  ELASTIC THERAPY  has a wide variety of well priced compression stockings. [place], [place] [id number] [id number] [place] ARE COPPER INFUSED COMPRESSION SOCKS AT [facility] OR [facility]  Walk as much as possible to increase blood flow.  Raise the foot of your bed at night with 2-inch blocks.  If you get a cut in the skin over the vein and the vein bleeds, lie down with your leg raised and press on it with a clean cloth until the bleeding stops. Then place a bandage (dressing) on the cut. See your caregiver if it continues to bleed or needs stitches. SEEK MEDICAL CARE IF:   The skin around your ankle starts to break down.  You have pain, redness, tenderness, or hard swelling developing in your leg over a vein.  You are uncomfortable due to leg pain. Document Released: 02/04/2005 Document Revised: 07/20/2011 Document Reviewed: 06/23/2010 [facility] Patient Information 2014 [place].    Benign Positional Vertigo Vertigo is the feeling that you or your surroundings are moving when they are not. Benign positional vertigo is the most common form of vertigo. The cause of this condition is  not serious (is benign). This condition is triggered by certain movements and positions (is positional). This condition can be dangerous if it occurs while you are doing something that could endanger you or others, such as driving.  CAUSES In many cases, the cause of this condition is not known. It may be caused by a disturbance in an area of the inner ear that helps your brain to sense movement and balance. This disturbance can be caused by a viral infection (labyrinthitis), head injury, or repetitive motion. RISK FACTORS This  condition is more likely to develop in:  Women.  People who are 42 years of age or older. SYMPTOMS Symptoms of this condition usually happen when you move your head or your eyes in different directions. Symptoms may start suddenly, and they usually last for less than a minute. Symptoms may include:  Loss of balance and falling.  Feeling like you are spinning or moving.  Feeling like your surroundings are spinning or moving.  Nausea and vomiting.  Blurred vision.  Dizziness.  Involuntary eye movement (nystagmus). Symptoms can be mild and cause only slight annoyance, or they can be severe and interfere with daily life. Episodes of benign positional vertigo may return (recur) over time, and they may be triggered by certain movements. Symptoms may improve over time. DIAGNOSIS This condition is usually diagnosed by medical history and a physical exam of the head, neck, and ears. You may be referred to a health care provider who specializes in ear, nose, and throat (ENT) problems (otolaryngologist) or a provider who specializes in disorders of the nervous system (neurologist). You may have additional testing, including:  MRI.  A CT scan.  Eye movement tests. Your health care provider may ask you to change positions quickly while he or she watches you for symptoms of benign positional vertigo, such as nystagmus. Eye movement may be tested with an electronystagmogram (ENG), caloric stimulation, the Dix-Hallpike test, or the roll test.  An electroencephalogram (EEG). This records electrical activity in your brain.  Hearing tests. TREATMENT Usually, your health care provider will treat this by moving your head in specific positions to adjust your inner ear back to normal. Surgery may be needed in severe cases, but this is rare. In some cases, benign positional vertigo may resolve on its own in 2-4 weeks. HOME CARE INSTRUCTIONS Safety  Move slowly.Avoid sudden body or head  movements.  Avoid driving.  Avoid operating heavy machinery.  Avoid doing any tasks that would be dangerous to you or others if a vertigo episode would occur.  If you have trouble walking or keeping your balance, try using a cane for stability. If you feel dizzy or unstable, sit down right away.  Return to your normal activities as told by your health care provider. Ask your health care provider what activities are safe for you. General Instructions  Take over-the-counter and prescription medicines only as told by your health care provider.  Avoid certain positions or movements as told by your health care provider.  Drink enough fluid to keep your urine clear or pale yellow.  Keep all follow-up visits as told by your health care provider. This is important. SEEK MEDICAL CARE IF:  You have a fever.  Your condition gets worse or you develop new symptoms.  Your family or friends notice any behavioral changes.  Your nausea or vomiting gets worse.  You have numbness or a "pins and needles" sensation. SEEK IMMEDIATE MEDICAL CARE IF:  You have difficulty speaking or moving.  You are always dizzy.  You faint.  You develop severe headaches.  You have weakness in your legs or arms.  You have changes in your hearing or vision.  You develop a stiff neck.  You develop sensitivity to light.   This information is not intended to replace advice given to you by your health care provider. Make sure you discuss any questions you have with your health care provider.   Document Released: 02/02/2006 Document Revised: 01/16/2015 Document Reviewed: 08/20/2014 Elsevier Interactive Patient Education [employer].

## 2015-12-03 ENCOUNTER — Other Ambulatory Visit: Payer: Self-pay | Admitting: Physician Assistant

## 2015-12-03 DIAGNOSIS — E039 Hypothyroidism, unspecified: Secondary | ICD-10-CM

## 2015-12-03 LAB — HEPATIC FUNCTION PANEL
ALK PHOS: 70 U/L (ref 33–115)
ALT: 11 U/L (ref 6–29)
AST: 11 U/L (ref 10–30)
Albumin: 3.7 g/dL (ref 3.6–5.1)
BILIRUBIN DIRECT: 0 mg/dL (ref <=0.2)
BILIRUBIN TOTAL: 0.2 mg/dL (ref 0.2–1.2)
Indirect Bilirubin: 0.2 mg/dL (ref 0.2–1.2)
Total Protein: 6.4 g/dL (ref 6.1–8.1)

## 2015-12-03 LAB — LIPID PANEL
CHOLESTEROL: 160 mg/dL (ref 125–200)
HDL: 35 mg/dL — ABNORMAL LOW (ref >=46)
LDL Cholesterol: 88 mg/dL (ref <130)
TRIGLYCERIDES: 185 mg/dL — AB (ref <150)
Total CHOL/HDL Ratio: 4.6 Ratio (ref <=5.0)
VLDL: 37 mg/dL — AB (ref <30)

## 2015-12-03 LAB — HEMOGLOBIN A1C
Hgb A1c MFr Bld: 5.9 % — ABNORMAL HIGH (ref <5.7)
Mean Plasma Glucose: 123 mg/dL

## 2015-12-03 LAB — TSH: TSH: 5.17 m[IU]/L — AB

## 2015-12-03 MED ORDER — LEVOTHYROXINE SODIUM 50 MCG PO TABS: 50.0000 ug | ORAL_TABLET | Freq: Every day | ORAL | 11 refills | Status: DC

## 2015-12-09 ENCOUNTER — Encounter: Payer: Self-pay | Admitting: Physician Assistant

## 2015-12-26 ENCOUNTER — Other Ambulatory Visit: Payer: Self-pay | Admitting: Internal Medicine

## 2015-12-26 ENCOUNTER — Other Ambulatory Visit: Payer: Self-pay | Admitting: Physician Assistant

## 2015-12-26 DIAGNOSIS — F988 Other specified behavioral and emotional disorders with onset usually occurring in childhood and adolescence: Secondary | ICD-10-CM

## 2015-12-30 DIAGNOSIS — G43001 Migraine without aura, not intractable, with status migrainosus: Secondary | ICD-10-CM | POA: Insufficient documentation

## 2016-01-06 ENCOUNTER — Other Ambulatory Visit: Payer: Self-pay

## 2016-01-07 ENCOUNTER — Encounter: Payer: Self-pay | Admitting: Physician Assistant

## 2016-01-15 ENCOUNTER — Other Ambulatory Visit: Payer: Self-pay | Admitting: Physician Assistant

## 2016-01-15 DIAGNOSIS — F988 Other specified behavioral and emotional disorders with onset usually occurring in childhood and adolescence: Secondary | ICD-10-CM

## 2016-01-15 MED ORDER — AMPHETAMINE-DEXTROAMPHETAMINE 20 MG PO TABS: ORAL_TABLET | ORAL | 0 refills | Status: DC

## 2016-01-16 ENCOUNTER — Other Ambulatory Visit: Payer: PRIVATE HEALTH INSURANCE

## 2016-01-16 DIAGNOSIS — E039 Hypothyroidism, unspecified: Secondary | ICD-10-CM

## 2016-01-16 DIAGNOSIS — I10 Essential (primary) hypertension: Secondary | ICD-10-CM

## 2016-01-16 LAB — TSH: TSH: 1.79 mIU/L

## 2016-02-24 ENCOUNTER — Other Ambulatory Visit: Payer: Self-pay | Admitting: Physician Assistant

## 2016-02-24 DIAGNOSIS — F988 Other specified behavioral and emotional disorders with onset usually occurring in childhood and adolescence: Secondary | ICD-10-CM

## 2016-02-24 MED ORDER — AMPHETAMINE-DEXTROAMPHETAMINE 20 MG PO TABS: ORAL_TABLET | ORAL | 0 refills | Status: DC

## 2016-03-05 ENCOUNTER — Ambulatory Visit: Payer: Self-pay | Admitting: Physician Assistant

## 2016-03-18 ENCOUNTER — Other Ambulatory Visit: Payer: Self-pay | Admitting: Physician Assistant

## 2016-03-19 MED ORDER — SERTRALINE HCL 100 MG PO TABS: 100.0000 mg | ORAL_TABLET | Freq: Every day | ORAL | 0 refills | Status: DC

## 2016-03-20 ENCOUNTER — Encounter: Payer: Self-pay | Admitting: Physician Assistant

## 2016-03-28 ENCOUNTER — Other Ambulatory Visit: Payer: Self-pay | Admitting: Physician Assistant

## 2016-03-28 DIAGNOSIS — J4541 Moderate persistent asthma with (acute) exacerbation: Secondary | ICD-10-CM

## 2016-04-06 ENCOUNTER — Other Ambulatory Visit: Payer: Self-pay | Admitting: Internal Medicine

## 2016-04-06 DIAGNOSIS — F988 Other specified behavioral and emotional disorders with onset usually occurring in childhood and adolescence: Secondary | ICD-10-CM

## 2016-04-06 MED ORDER — AMPHETAMINE-DEXTROAMPHETAMINE 20 MG PO TABS: ORAL_TABLET | ORAL | 0 refills | Status: DC

## 2016-04-13 ENCOUNTER — Other Ambulatory Visit: Payer: Self-pay | Admitting: Physician Assistant

## 2016-04-13 ENCOUNTER — Encounter: Payer: Self-pay | Admitting: Physician Assistant

## 2016-04-13 MED ORDER — NYSTATIN 100000 UNIT/GM EX POWD: CUTANEOUS | 2 refills | Status: DC

## 2016-04-13 NOTE — Progress Notes (Signed)
Sent in nystatin powder for patient to use, will schedule if rash is not better.

## 2016-05-05 ENCOUNTER — Other Ambulatory Visit: Payer: Self-pay | Admitting: Physician Assistant

## 2016-05-05 DIAGNOSIS — J4541 Moderate persistent asthma with (acute) exacerbation: Secondary | ICD-10-CM

## 2016-05-15 ENCOUNTER — Other Ambulatory Visit: Payer: Self-pay | Admitting: Internal Medicine

## 2016-05-15 DIAGNOSIS — F988 Other specified behavioral and emotional disorders with onset usually occurring in childhood and adolescence: Secondary | ICD-10-CM

## 2016-05-18 MED ORDER — AMPHETAMINE-DEXTROAMPHETAMINE 20 MG PO TABS: ORAL_TABLET | ORAL | 0 refills | Status: DC

## 2016-06-20 ENCOUNTER — Other Ambulatory Visit: Payer: Self-pay | Admitting: Internal Medicine

## 2016-06-25 ENCOUNTER — Encounter: Payer: Self-pay | Admitting: Physician Assistant

## 2016-06-25 ENCOUNTER — Ambulatory Visit (INDEPENDENT_AMBULATORY_CARE_PROVIDER_SITE_OTHER): Payer: PRIVATE HEALTH INSURANCE | Admitting: Physician Assistant

## 2016-06-25 DIAGNOSIS — E559 Vitamin D deficiency, unspecified: Secondary | ICD-10-CM

## 2016-06-25 DIAGNOSIS — Z0000 Encounter for general adult medical examination without abnormal findings: Secondary | ICD-10-CM | POA: Diagnosis not present

## 2016-06-25 DIAGNOSIS — R7309 Other abnormal glucose: Secondary | ICD-10-CM

## 2016-06-25 DIAGNOSIS — Z79899 Other long term (current) drug therapy: Secondary | ICD-10-CM | POA: Diagnosis not present

## 2016-06-25 DIAGNOSIS — D649 Anemia, unspecified: Secondary | ICD-10-CM

## 2016-06-25 DIAGNOSIS — K219 Gastro-esophageal reflux disease without esophagitis: Secondary | ICD-10-CM

## 2016-06-25 DIAGNOSIS — Z1389 Encounter for screening for other disorder: Secondary | ICD-10-CM

## 2016-06-25 DIAGNOSIS — R42 Dizziness and giddiness: Secondary | ICD-10-CM

## 2016-06-25 DIAGNOSIS — F419 Anxiety disorder, unspecified: Secondary | ICD-10-CM

## 2016-06-25 DIAGNOSIS — J45901 Unspecified asthma with (acute) exacerbation: Secondary | ICD-10-CM

## 2016-06-25 DIAGNOSIS — Z720 Tobacco use: Secondary | ICD-10-CM | POA: Insufficient documentation

## 2016-06-25 DIAGNOSIS — F988 Other specified behavioral and emotional disorders with onset usually occurring in childhood and adolescence: Secondary | ICD-10-CM

## 2016-06-25 DIAGNOSIS — Z9889 Other specified postprocedural states: Secondary | ICD-10-CM | POA: Insufficient documentation

## 2016-06-25 DIAGNOSIS — E282 Polycystic ovarian syndrome: Secondary | ICD-10-CM

## 2016-06-25 DIAGNOSIS — E785 Hyperlipidemia, unspecified: Secondary | ICD-10-CM

## 2016-06-25 LAB — HEPATIC FUNCTION PANEL
ALBUMIN: 3.7 g/dL (ref 3.6–5.1)
ALT: 12 U/L (ref 6–29)
AST: 15 U/L (ref 10–30)
Alkaline Phosphatase: 82 U/L (ref 33–115)
BILIRUBIN TOTAL: 0.3 mg/dL (ref 0.2–1.2)
Bilirubin, Direct: 0.1 mg/dL (ref <=0.2)
Indirect Bilirubin: 0.2 mg/dL (ref 0.2–1.2)
Total Protein: 7.1 g/dL (ref 6.1–8.1)

## 2016-06-25 LAB — CBC WITH DIFFERENTIAL/PLATELET
BASOS PCT: 0 %
Basophils Absolute: 0 cells/uL (ref 0–200)
EOS ABS: 98 {cells}/uL (ref 15–500)
Eosinophils Relative: 1 %
HCT: 31.2 % — ABNORMAL LOW (ref 35.0–45.0)
Hemoglobin: 9.5 g/dL — ABNORMAL LOW (ref 11.7–15.5)
LYMPHS PCT: 24 %
Lymphs Abs: 2352 cells/uL (ref 850–3900)
MCH: 23.1 pg — AB (ref 27.0–33.0)
MCHC: 30.4 g/dL — AB (ref 32.0–36.0)
MCV: 75.9 fL — ABNORMAL LOW (ref 80.0–100.0)
MONOS PCT: 6 %
MPV: 8.8 fL (ref 7.5–12.5)
Monocytes Absolute: 588 cells/uL (ref 200–950)
NEUTROS PCT: 69 %
Neutro Abs: 6762 cells/uL (ref 1500–7800)
PLATELETS: 398 10*3/uL (ref 140–400)
RBC: 4.11 MIL/uL (ref 3.80–5.10)
RDW: 16.4 % — AB (ref 11.0–15.0)
WBC: 9.8 10*3/uL (ref 3.8–10.8)

## 2016-06-25 LAB — BASIC METABOLIC PANEL WITH GFR
BUN: 12 mg/dL (ref 7–25)
CALCIUM: 8.8 mg/dL (ref 8.6–10.2)
CO2: 27 mmol/L (ref 20–31)
CREATININE: 0.73 mg/dL (ref 0.50–1.10)
Chloride: 103 mmol/L (ref 98–110)
GFR, Est Non African American: >89 mL/min (ref >=60)
GLUCOSE: 93 mg/dL (ref 65–99)
POTASSIUM: 4 mmol/L (ref 3.5–5.3)
Sodium: 138 mmol/L (ref 135–146)

## 2016-06-25 LAB — LIPID PANEL
CHOLESTEROL: 174 mg/dL (ref <200)
HDL: 37 mg/dL — AB (ref >50)
LDL Cholesterol: 94 mg/dL (ref <100)
TRIGLYCERIDES: 216 mg/dL — AB (ref <150)
Total CHOL/HDL Ratio: 4.7 Ratio (ref <5.0)
VLDL: 43 mg/dL — ABNORMAL HIGH (ref <30)

## 2016-06-25 LAB — IRON AND TIBC
%SAT: 7 % — ABNORMAL LOW (ref 11–50)
Iron: 32 ug/dL — ABNORMAL LOW (ref 40–190)
TIBC: 471 ug/dL — AB (ref 250–450)
UIBC: 439 ug/dL — ABNORMAL HIGH (ref 125–400)

## 2016-06-25 LAB — MAGNESIUM: MAGNESIUM: 2.1 mg/dL (ref 1.5–2.5)

## 2016-06-25 LAB — TSH: TSH: 2.85 m[IU]/L

## 2016-06-25 LAB — FERRITIN: Ferritin: 7 ng/mL — ABNORMAL LOW (ref 10–232)

## 2016-06-25 MED ORDER — OMEPRAZOLE 20 MG PO CPDR: 20.0000 mg | DELAYED_RELEASE_CAPSULE | Freq: Every day | ORAL | 3 refills | Status: DC

## 2016-06-25 MED ORDER — LEVOCETIRIZINE DIHYDROCHLORIDE 5 MG PO TABS: 5.0000 mg | ORAL_TABLET | Freq: Every evening | ORAL | 2 refills | Status: DC

## 2016-06-25 NOTE — Progress Notes (Signed)
Complete Physical  Assessment and Plan:  Morbid Obesity with co morbidities - long discussion about weight loss, diet, and exercise -     TSH  Other abnormal glucose Discussed general issues about diabetes pathophysiology and management., Educational material distributed., Suggested low cholesterol diet., Encouraged aerobic exercise., Discussed foot care., Reminded to get yearly retinal exam. -     TSH -     Hemoglobin A1c  Hyperlipidemia, unspecified hyperlipidemia type -continue medications, check lipids, decrease fatty foods, increase activity.  -     CBC with Differential/Platelet -     BASIC METABOLIC PANEL WITH GFR -     Hepatic function panel -     TSH -     Lipid panel  Vertigo Continue neuro follow up -     levocetirizine (XYZAL) 5 MG tablet; Take 1 tablet (5 mg total) by mouth every evening.   Attention deficit disorder, unspecified hyperactivity presence Continue neuro follow up  Anxiety continue medications, stress management techniques discussed, increase water, good sleep hygiene discussed, increase exercise, and increase veggies.   PCOS (polycystic ovarian syndrome) Continue weight loss  Gastroesophageal reflux disease, esophagitis presence not specified Continue PPI/H2 blocker, diet discussed -     omeprazole (PRILOSEC) 20 MG capsule; Take 1 capsule (20 mg total) by mouth daily.  Asthma with acute exacerbation, unspecified asthma severity, unspecified whether persistent Avoid triggers, continue singulair  Medication management -     Magnesium  Vitamin D deficiency -     VITAMIN D 25 Hydroxy (Vit-D Deficiency, Fractures)  Screening for blood or protein in urine -     Urinalysis, Routine w reflex microscopic -     Microalbumin / creatinine urine ratio  Anemia, unspecified type -     Ferritin -     Iron and TIBC  Discussed med's effects and SE's. Screening labs and tests as requested with regular follow-up as recommended. Over 40 minutes of exam,  counseling, chart review, and complex, high level critical decision making was performed this visit.   HPI  41 y.o. female  presents for a complete physical and follow up for has Short cervix affecting pregnancy; H/O: cesarean section; Anxiety; Asthma; GERD (gastroesophageal reflux disease); ADD (attention deficit disorder); Hyperlipidemia; PCOS (polycystic ovarian syndrome); Other abnormal glucose; Morbid obesity ([place]); Ankle sprain; Carpal tunnel syndrome; and Vertigo on her problem list..  Her blood pressure has been controlled at home, today their BP is BP: 118/76 She does not workout. She denies chest pain, shortness of breath, dizziness.   She has had vertigo history, has seen neurologist, for migraines/vertigo, and was put on topamax XR but has helped with HA, seeing Dr. Doy Hutching, on ADD medication and she is using that too.   She is not on cholesterol medication and denies myalgias. Her cholesterol is at goal. The cholesterol last visit was:   Lab Results  Component Value Date   CHOL 160 12/02/2015   HDL 35 (L) 12/02/2015   LDLCALC 88 12/02/2015   TRIG 185 (H) 12/02/2015   CHOLHDL 4.6 12/02/2015   She has been working on diet and exercise for prediabetes,   and denies paresthesia of the feet, polydipsia, polyuria and visual disturbances. Last A1C in the office was:  Lab Results  Component Value Date   HGBA1C 5.9 (H) 12/02/2015   Patient is on Vitamin D supplement.   Lab Results  Component Value Date   VD25OH 19 (L) 05/27/2015     She is on thyroid medication. Her medication was not  changed last visit.   Lab Results  Component Value Date   TSH 1.79 01/16/2016  .  BMI is Body mass index is 42.56 kg/m., she is working on diet and exercise. Wt Readings from Last 3 Encounters:  06/25/16 288 lb 3.2 oz (130.7 kg)  12/02/15 297 lb (134.7 kg)  09/30/15 295 lb 3.2 oz (133.9 kg)     Current Medications:  Current Outpatient Prescriptions on File Prior to Visit  Medication Sig  Dispense Refill  . Cetirizine HCl (ZYRTEC ALLERGY) 10 MG CAPS Take 1 capsule (10 mg total) by mouth daily. 90 capsule 1  . fluticasone (FLONASE) 50 MCG/ACT nasal spray Place 2 sprays into both nostrils at bedtime. 16 g 1  . ibuprofen (ADVIL,MOTRIN) 600 MG tablet Take 1 tablet (600 mg total) by mouth every 6 (six) hours. 30 tablet 0  . levothyroxine (SYNTHROID) 50 MCG tablet Take 1 tablet (50 mcg total) by mouth daily. 30 tablet 11  . losartan-hydrochlorothiazide (HYZAAR) 100-25 MG tablet Take 1 tablet by mouth daily. 90 tablet 1  . montelukast (SINGULAIR) 10 MG tablet TAKE ONE TABLET BY MOUTH ONCE DAILY 30 tablet 2  . nystatin (MYCOSTATIN/NYSTOP) powder Apply daily after a shower as needed 60 g 2  . omeprazole (PRILOSEC) 20 MG capsule TAKE ONE CAPSULE BY MOUTH ONCE DAILY 30 capsule 0  . ondansetron (ZOFRAN) 4 MG tablet Take 1 tablet (4 mg total) by mouth every 8 (eight) hours as needed for nausea or vomiting. 30 tablet 1  . sertraline (ZOLOFT) 100 MG tablet Take 1 tablet (100 mg total) by mouth daily. 30 tablet 0  . VENTOLIN HFA 108 (90 Base) MCG/ACT inhaler INHALE 1 PUFF INTO LUNGS EVERY 6 HOURS AS NEEDED FOR WEEZING OR SHORTNESS OF BREATH 18 g 1  . amphetamine-dextroamphetamine (ADDERALL) 20 MG tablet Take 1/2 to 1 tablet 2 x day if needed for ADD - Do not take daily, more effective this way, take drug holidays 60 tablet 0   No current facility-administered medications on file prior to visit.    Allergies:  Allergies  Allergen Reactions  . Lisinopril Shortness Of Breath and Cough    Breathing problems   . Bisoprolol     Chest tightness   . Latex Rash  . Mobic [Meloxicam] Itching   Medical History:  She has Short cervix affecting pregnancy; H/O: cesarean section; Anxiety; Asthma; GERD (gastroesophageal reflux disease); ADD (attention deficit disorder); Hyperlipidemia; PCOS (polycystic ovarian syndrome); Other abnormal glucose; Morbid obesity ([place]); Ankle sprain; Carpal tunnel syndrome;  and Vertigo on her problem list.   Health Maintenance:   Immunization History  Administered Date(s) Administered  . HPV Quadrivalent 02/04/2011  . Td 04/10/2008  . Tdap 03/15/2014   Tetanus: 2015 Pneumovax: Prevnar 13:  Flu vaccine: Zostavax: HPV 2012  Patient's last menstrual period was 06/12/2016. Pap: TODAY MGM: going to schedule  DEXA: N/A Colonoscopy: N/A EGD: N/A  Patient Care Team: Unk Pinto, MD as PCP - General (Internal Medicine) Janyth Contes, MD as Consulting Physician (Obstetrics and Gynecology) Drake Leach, OD (Optometry)  Surgical History:  She has a past surgical history that includes Cervical cerclage (N/A, 04/21/2013); Cesarean section; Tonsillectomy; Bartholin cyst marsupialization; Therapeutic abortion; Wisdom tooth extraction; LEEP; Cesarean section (N/A, 08/31/2013); and Tubal ligation. Family History:  Herfamily history includes Cancer in her maternal aunt, mother, and paternal uncle; Diabetes in her maternal grandfather and paternal grandmother; Heart disease in her maternal grandfather and paternal grandfather; Hypertension in her maternal grandfather, maternal grandmother, mother, and paternal grandfather;  Stroke in her maternal grandmother. Social History:  She reports that she has been smoking Cigarettes.  She has a 12.50 pack-year smoking history. She has never used smokeless tobacco. She reports that she does not drink alcohol or use drugs.  Review of Systems: Review of Systems  Constitutional: Positive for malaise/fatigue. Negative for chills and fever.  HENT: Positive for congestion. Negative for ear pain and sore throat.   Respiratory: Negative for cough, sputum production, shortness of breath and wheezing.   Cardiovascular: Negative for chest pain, palpitations and leg swelling.  Gastrointestinal: Negative.   Genitourinary: Negative.   Musculoskeletal: Negative.   Neurological: Negative for tingling, tremors, sensory change,  speech change, focal weakness, seizures and loss of consciousness. Dizziness:  vertigo.  Psychiatric/Behavioral: Negative.     Physical Exam: Estimated body mass index is 42.56 kg/m as calculated from the following:   Height as of this encounter: 5\' 9"  (1.753 m).   Weight as of this encounter: 288 lb 3.2 oz (130.7 kg). BP 118/76   Pulse 74   Temp 97.7 F (36.5 C)   Resp 16   Ht 5\' 9"  (1.753 m)   Wt 288 lb 3.2 oz (130.7 kg)   LMP 06/12/2016   SpO2 98%   BMI 42.56 kg/m  General Appearance: Well nourished, in no apparent distress.  Eyes: PERRLA, EOMs, conjunctiva no swelling or erythema, normal fundi and vessels.  Sinuses: No Frontal/maxillary tenderness  ENT/Mouth: Ext aud canals clear, normal light reflex with TMs without erythema, bulging. Good dentition. No erythema, swelling, or exudate on post pharynx. Tonsils not swollen or erythematous. Hearing normal.  Neck: Supple, thyroid normal. No bruits  Respiratory: Respiratory effort normal, BS equal bilaterally without rales, rhonchi, wheezing or stridor.  Cardio: RRR without murmurs, rubs or gallops. Brisk peripheral pulses without edema.  Chest: symmetric, with normal excursions and percussion.  Breasts: Symmetric, without lumps, nipple discharge, retractions.  Abdomen: Soft, nontender, no guarding, rebound, hernias, masses, or organomegaly.  Lymphatics: Non tender without lymphadenopathy.  Genitourinary:  Musculoskeletal: Full ROM all peripheral extremities,5/5 strength, and normal gait.  Skin: Warm, dry without rashes, lesions, ecchymosis. Neuro: Cranial nerves intact, reflexes equal bilaterally. Normal muscle tone, no cerebellar symptoms. Sensation intact.  Psych: Awake and oriented X 3, normal affect, Insight and Judgment appropriate.   EKG: declines AORTA SCAN: declines   Vicie Mutters 3:51 PM [facility]

## 2016-06-26 LAB — HEMOGLOBIN A1C
Hgb A1c MFr Bld: 5.9 % — ABNORMAL HIGH (ref <5.7)
Mean Plasma Glucose: 123 mg/dL

## 2016-06-26 LAB — MICROALBUMIN / CREATININE URINE RATIO
Creatinine, Urine: 180 mg/dL (ref 20–320)
Microalb Creat Ratio: 5 mcg/mg creat (ref <30)
Microalb, Ur: 0.9 mg/dL

## 2016-06-26 LAB — VITAMIN D 25 HYDROXY (VIT D DEFICIENCY, FRACTURES): VIT D 25 HYDROXY: 26 ng/mL — AB (ref 30–100)

## 2016-06-26 LAB — URINALYSIS, ROUTINE W REFLEX MICROSCOPIC
Bilirubin Urine: NEGATIVE
GLUCOSE, UA: NEGATIVE
HGB URINE DIPSTICK: NEGATIVE
KETONES UR: NEGATIVE
Leukocytes, UA: NEGATIVE
NITRITE: NEGATIVE
PH: 7.5 (ref 5.0–8.0)
PROTEIN: NEGATIVE
Specific Gravity, Urine: 1.02 (ref 1.001–1.035)

## 2016-06-26 NOTE — Progress Notes (Signed)
LVM for pt to return office call.  A message was sent to front office to schedule a 2-4wk OV for iron def & a hemo card was mailed out to pt on 16th feb 18.

## 2016-07-12 ENCOUNTER — Other Ambulatory Visit: Payer: Self-pay | Admitting: Physician Assistant

## 2016-07-12 ENCOUNTER — Other Ambulatory Visit: Payer: Self-pay | Admitting: Internal Medicine

## 2016-07-12 DIAGNOSIS — I10 Essential (primary) hypertension: Secondary | ICD-10-CM

## 2016-08-10 ENCOUNTER — Encounter: Payer: Self-pay | Admitting: *Deleted

## 2016-11-09 ENCOUNTER — Other Ambulatory Visit: Payer: Self-pay | Admitting: Physician Assistant

## 2016-11-09 ENCOUNTER — Encounter: Payer: Self-pay | Admitting: Physician Assistant

## 2016-11-09 MED ORDER — MECLIZINE HCL 25 MG PO TABS: ORAL_TABLET | ORAL | 0 refills | Status: DC

## 2016-11-16 NOTE — Progress Notes (Signed)
Assessment and Plan:  Hypertension -Continue medication, monitor blood pressure at home. Continue DASH diet.  Reminder to go to the ER if any CP, SOB, nausea, dizziness, severe HA, changes vision/speech, left arm numbness and tingling and jaw pain.   Cholesterol -Continue diet and exercise. Check cholesterol.   Prediabetes --> DM range -Continue diet and exercise. Check A1C  Asthma, moderate persistent, with acute exacerbation - montelukast (SINGULAIR) 10 MG tablet; Take 1 tablet (10 mg total) by mouth daily.  Dispense: 30 tablet; Refill: 2   Morbid obesity, unspecified obesity type ([place]) Obesity with co morbidities- long discussion about weight loss, diet, and exercise  ADD (attention deficit disorder) - getting from neurologist  Vertigo Normal neuro, normal ENT Can take the antivert Start flonase night  Continue diet and meds as discussed. Further disposition pending results of labs. Over 30 minutes of exam, counseling, chart review, and critical decision making was performed  HPI 41 y.o. female  presents for 3 month follow up on hypertension, cholesterol, prediabetes, and vitamin D deficiency.   Her blood pressure has been controlled at home, today their BP is BP: 128/90  She does not workout. She denies chest pain, shortness of breath, dizziness.  She is not on cholesterol medication and denies myalgias. Her cholesterol is at goal. The cholesterol last visit was:   Lab Results  Component Value Date   CHOL 174 06/25/2016   HDL 37 (L) 06/25/2016   LDLCALC 94 06/25/2016   TRIG 216 (H) 06/25/2016   CHOLHDL 4.7 06/25/2016    She has been working on diet and exercise for prediabetes, she is off metformin due to stomach pain/diarrhea, and denies paresthesia of the feet, polydipsia, polyuria and visual disturbances. Last A1C in the office was:  Lab Results  Component Value Date   HGBA1C 5.9 (H) 06/25/2016   Patient is on Vitamin D supplement.   Lab Results  Component Value  Date   VD25OH 26 (L) 06/25/2016     She has history of vertigo and migraines, on topamax, normal CT scan, has seen neurology and ENT, had 5 days where she could not work due to vertigo, states that has not improved and she called neuro and they said to call here. She would like a second opinion. Increase stress leaving husband, daughter bullying at school, increase stress. Had had worse IBs.  She is on Adderall 1/2-1 tablet daily.  BMI is Body mass index is 41.35 kg/m., she is working on diet and exercise. Wt Readings from Last 3 Encounters:  11/17/16 280 lb (127 kg)  06/25/16 288 lb 3.2 oz (130.7 kg)  12/02/15 297 lb (134.7 kg)    Current Medications:  Current Outpatient Prescriptions on File Prior to Visit  Medication Sig Dispense Refill  . fluticasone (FLONASE) 50 MCG/ACT nasal spray Place 2 sprays into both nostrils at bedtime. 16 g 1  . ibuprofen (ADVIL,MOTRIN) 600 MG tablet Take 1 tablet (600 mg total) by mouth every 6 (six) hours. 30 tablet 0  . levocetirizine (XYZAL) 5 MG tablet Take 1 tablet (5 mg total) by mouth every evening. 30 tablet 2  . levothyroxine (SYNTHROID) 50 MCG tablet Take 1 tablet (50 mcg total) by mouth daily. 30 tablet 11  . losartan-hydrochlorothiazide (HYZAAR) 100-25 MG tablet TAKE ONE TABLET BY MOUTH ONCE DAILY 90 tablet 1  . meclizine (ANTIVERT) 25 MG tablet 1/2-1 pill up to 3 times daily for motion sickness/dizziness 30 tablet 0  . montelukast (SINGULAIR) 10 MG tablet TAKE ONE TABLET BY  MOUTH ONCE DAILY 30 tablet 2  . nystatin (MYCOSTATIN/NYSTOP) powder Apply daily after a shower as needed 60 g 2  . omeprazole (PRILOSEC) 20 MG capsule Take 1 capsule (20 mg total) by mouth daily. 30 capsule 3  . ondansetron (ZOFRAN) 4 MG tablet Take 1 tablet (4 mg total) by mouth every 8 (eight) hours as needed for nausea or vomiting. 30 tablet 1  . sertraline (ZOLOFT) 100 MG tablet TAKE ONE TABLET BY MOUTH ONCE DAILY 90 tablet 0  . Topiramate (TOPAMAX PO) Take 50 mg by mouth.     . VENTOLIN HFA 108 (90 Base) MCG/ACT inhaler INHALE 1 PUFF INTO LUNGS EVERY 6 HOURS AS NEEDED FOR WEEZING OR SHORTNESS OF BREATH 18 g 1  . amphetamine-dextroamphetamine (ADDERALL) 20 MG tablet Take 1/2 to 1 tablet 2 x day if needed for ADD - Do not take daily, more effective this way, take drug holidays 60 tablet 0   No current facility-administered medications on file prior to visit.    Medical History:  Past Medical History:  Diagnosis Date  . Abnormal Pap smear    bx x2; LEEP  . ADD (attention deficit disorder)   . Anxiety    no meds  . Asthma   . Cervical cerclage suture present 04/21/2013  . Fibroid   . GERD (gastroesophageal reflux disease)   . Gestational diabetes mellitus, antepartum    GDM  . Hyperlipidemia   . Hypertension    no meds   . IBS (irritable bowel syndrome)   . Mental disorder    mild bipolar  . PCOS (polycystic ovarian syndrome)   . Short cervix affecting pregnancy 04/20/2013   Allergies:  Allergies  Allergen Reactions  . Lisinopril Shortness Of Breath and Cough    Breathing problems   . Bisoprolol     Chest tightness   . Latex Rash  . Mobic [Meloxicam] Itching     Review of Systems:  Review of Systems  Constitutional: Positive for malaise/fatigue. Negative for chills and fever.  HENT: Negative for congestion, ear pain and sore throat.   Respiratory: Negative for cough, sputum production, shortness of breath and wheezing.   Cardiovascular: Negative for chest pain, palpitations and leg swelling.  Gastrointestinal: Negative.   Genitourinary: Negative.   Musculoskeletal: Negative.   Neurological: Negative for tingling, tremors, sensory change, speech change, focal weakness, seizures and loss of consciousness. Dizziness:  vertigo.  Psychiatric/Behavioral: Negative.    Family history- Review and unchanged Social history- Review and unchanged Physical Exam: BP 128/90   Pulse 86   Resp 14   Ht 5\' 9"  (1.753 m)   Wt 280 lb (127 kg)   LMP  11/02/2016   SpO2 95%   BMI 41.35 kg/m  Wt Readings from Last 3 Encounters:  11/17/16 280 lb (127 kg)  06/25/16 288 lb 3.2 oz (130.7 kg)  12/02/15 297 lb (134.7 kg)   General Appearance: Well nourished, in no apparent distress. Eyes: PERRLA, EOMs, conjunctiva no swelling or erythema Sinuses: + Frontal/maxillary tenderness ENT/Mouth: Ext aud canals clear, TMs without erythema, bulging. No erythema, swelling, or exudate on post pharynx.  Tonsils not swollen or erythematous. Hearing normal.  Neck: Supple, thyroid normal.  Respiratory: Respiratory effort normal, BS equal bilaterally without rales, rhonchi, wheezing or stridor.  Cardio: RRR with no MRGs. Brisk peripheral pulses without edema.  Abdomen: Soft, + BS,  Non tender, no guarding, rebound, hernias, masses. Lymphatics: Non tender without lymphadenopathy.  Musculoskeletal: Full ROM, 5/5 strength, Normal gait Skin: Warm,  dry without rashes, lesions, ecchymosis.  Neuro: Cranial nerves intact. Normal muscle tone, no cerebellar symptoms. Psych: Awake and oriented X 3, normal affect, Insight and Judgment appropriate.    Vicie Mutters, PA-C 11:07 AM [facility]

## 2016-11-17 ENCOUNTER — Other Ambulatory Visit: Payer: Self-pay | Admitting: Physician Assistant

## 2016-11-17 ENCOUNTER — Ambulatory Visit (INDEPENDENT_AMBULATORY_CARE_PROVIDER_SITE_OTHER): Payer: PRIVATE HEALTH INSURANCE | Admitting: Physician Assistant

## 2016-11-17 VITALS — BP 128/90 | HR 86 | Resp 14 | Ht 69.0 in | Wt 280.0 lb

## 2016-11-17 DIAGNOSIS — E785 Hyperlipidemia, unspecified: Secondary | ICD-10-CM

## 2016-11-17 DIAGNOSIS — R42 Dizziness and giddiness: Secondary | ICD-10-CM | POA: Diagnosis not present

## 2016-11-17 DIAGNOSIS — R7309 Other abnormal glucose: Secondary | ICD-10-CM

## 2016-11-17 LAB — CBC WITH DIFFERENTIAL/PLATELET
BASOS ABS: 0 {cells}/uL (ref 0–200)
Basophils Relative: 0 %
EOS ABS: 75 {cells}/uL (ref 15–500)
Eosinophils Relative: 1 %
HEMATOCRIT: 30 % — AB (ref 35.0–45.0)
Hemoglobin: 9.2 g/dL — ABNORMAL LOW (ref 11.7–15.5)
LYMPHS ABS: 1800 {cells}/uL (ref 850–3900)
Lymphocytes Relative: 24 %
MCH: 22.4 pg — AB (ref 27.0–33.0)
MCHC: 30.7 g/dL — ABNORMAL LOW (ref 32.0–36.0)
MCV: 73 fL — AB (ref 80.0–100.0)
MPV: 9.2 fL (ref 7.5–12.5)
Monocytes Absolute: 525 cells/uL (ref 200–950)
Monocytes Relative: 7 %
NEUTROS ABS: 5100 {cells}/uL (ref 1500–7800)
Neutrophils Relative %: 68 %
Platelets: 369 10*3/uL (ref 140–400)
RBC: 4.11 MIL/uL (ref 3.80–5.10)
RDW: 17.7 % — ABNORMAL HIGH (ref 11.0–15.0)
WBC: 7.5 10*3/uL (ref 3.8–10.8)

## 2016-11-17 NOTE — Patient Instructions (Addendum)
Your ears and sinuses are connected by the eustachian tube. When your sinuses are inflamed, this can close off the tube and cause fluid to collect in your middle ear. This can then cause dizziness, popping, clicking, ringing, and echoing in your ears. This is often NOT an infection and does NOT require antibiotics, it is caused by inflammation so the treatments help the inflammation. This can take a long time to get better so please be patient.  Here are things you can do to help with this: - Try the Flonase or Nasonex. Remember to spray each nostril twice towards the outer part of your eye.  Do not sniff but instead pinch your nose and tilt your head back to help the medicine get into your sinuses.  The best time to do this is at bedtime.Stop if you get blurred vision or nose bleeds.  -While drinking fluids, pinch and hold nose close and swallow, to help open eustachian tubes to drain fluid behind ear drums. -can use decongestant over the counter, please do not use if you have high blood pressure or certain heart conditions.   if worsening HA, changes vision/speech, imbalance, weakness go to the ER  Can take the antivert as needed  Benefiber is good for constipation/diarrhea/irritable bowel syndrome, it helps with weight loss and can help lower your bad cholesterol. Please do 1 TBSP in the morning in water, coffee, or tea. It can take up to a month before you can see a difference with your bowel movements. It is cheapest from [employer], [employer], [employer].    Benign Paroxysmal Positional Vertigo (BPPV)  General Information In Benign Paroxysmal Positional Vertigo (BPPV) dizziness is generally thought to be due to debris which has collected within a part of the inner ear. This debris can be thought of as "ear rocks", although the formal name is "otoconia". Ear rocks are small crystals of calcium carbonate derived from a structure in the ear called the "utricle" (figure to the right ). The symptoms of BPPV  include dizziness or vertigo, lightheadedness, imbalance, and nausea. Activities which bring on symptoms will vary among persons, but symptoms are usually followed by a change of position of the head like getting out of bed or rolling over in bed are common "problem" motions .  However if you have worsening HA, changes vision/speech, weakness go to the ER     What can be done? 1) Use two or more pillows at night. Avoid sleeping on the "bad" side. In the morning, get up slowly and sit on the edge of the bed for a minute. 2) Medication prescribed by your doctor.  3) The exercises below, you can do at home to help you prevent the sensation later in the day.  4) We can refer you to physical therapy at [facility], # [place] treatments: The Brandt-Daroff Exercises are a home method of treating BPPV,and are effective 95% of the time.  These exercises are performed in three sets per day for two weeks. In each set, one performs the maneuver as shown five times. Start sitting upright (position 1). Then move into the side-lying position (position 2), with the head angled upward about halfway. An easy way to remember this is to imagine someone standing about 6 feet in front of you, and just keep looking at their head at all times. Stay in the side-lying position for 30 seconds, or until the dizziness subsides if this is longer, then go back to the sitting  position (position 3). Stay there for 30 seconds, and then go to the opposite side (position 4) and follow the same routine.  At home Epley Maneuver This procedure seems to be even more effective than the in-office procedure, perhaps because it is repeated every night for a week.  The method (for the left side) is performed as shown on the figure below.  1) One stays in each of the supine (lying down) positions for 30 seconds, and in the sitting upright position (top) for 1 minute.  2) Thus, once cycle takes 2 1/2 minutes.   3) Typically 3 cycles are performed just prior to going to sleep.  4) It is best to do them at night rather than in the morning or midday, as if one becomes dizzy following the exercises, then it can resolve while one is sleeping.  The mirror image of this procedure is used for the right ear.  Google half cartwheel

## 2016-11-18 LAB — HEPATIC FUNCTION PANEL
ALBUMIN: 3.5 g/dL — AB (ref 3.6–5.1)
ALK PHOS: 82 U/L (ref 33–115)
ALT: 9 U/L (ref 6–29)
AST: 13 U/L (ref 10–30)
BILIRUBIN INDIRECT: 0.2 mg/dL (ref 0.2–1.2)
BILIRUBIN TOTAL: 0.2 mg/dL (ref 0.2–1.2)
Bilirubin, Direct: 0 mg/dL (ref <=0.2)
Total Protein: 6.7 g/dL (ref 6.1–8.1)

## 2016-11-18 LAB — BASIC METABOLIC PANEL WITH GFR
BUN: 11 mg/dL (ref 7–25)
CHLORIDE: 104 mmol/L (ref 98–110)
CO2: 24 mmol/L (ref 20–31)
Calcium: 8.7 mg/dL (ref 8.6–10.2)
Creat: 0.77 mg/dL (ref 0.50–1.10)
GFR, Est African American: >89 mL/min (ref >=60)
GFR, Est Non African American: >89 mL/min (ref >=60)
GLUCOSE: 120 mg/dL — AB (ref 65–99)
POTASSIUM: 4.2 mmol/L (ref 3.5–5.3)
Sodium: 137 mmol/L (ref 135–146)

## 2016-11-18 LAB — LIPID PANEL
Cholesterol: 142 mg/dL (ref <200)
HDL: 35 mg/dL — AB (ref >50)
LDL CALC: 69 mg/dL (ref <100)
Total CHOL/HDL Ratio: 4.1 Ratio (ref <5.0)
Triglycerides: 189 mg/dL — ABNORMAL HIGH (ref <150)
VLDL: 38 mg/dL — AB (ref <30)

## 2016-11-18 LAB — HEMOGLOBIN A1C
HEMOGLOBIN A1C: 6 % — AB (ref <5.7)
Mean Plasma Glucose: 126 mg/dL

## 2016-11-18 LAB — TSH: TSH: 3.07 mIU/L

## 2016-11-18 NOTE — Progress Notes (Signed)
LVM for pt to return office call for LAB results & in order to ascertain so information  . Pt is aware of lab results via mychart.

## 2016-11-19 LAB — IRON,TIBC AND FERRITIN PANEL
%SAT: 8 % — ABNORMAL LOW (ref 11–50)
Ferritin: 5 ng/mL — ABNORMAL LOW (ref 10–232)
Iron: 36 ug/dL — ABNORMAL LOW (ref 40–190)
TIBC: 439 ug/dL (ref 250–450)

## 2016-11-19 NOTE — Progress Notes (Signed)
Pt aware of lab results & voiced understanding of those results.

## 2016-12-24 ENCOUNTER — Ambulatory Visit: Payer: Self-pay | Admitting: Physician Assistant

## 2016-12-26 ENCOUNTER — Other Ambulatory Visit: Payer: Self-pay | Admitting: Internal Medicine

## 2016-12-26 ENCOUNTER — Other Ambulatory Visit: Payer: Self-pay | Admitting: Physician Assistant

## 2016-12-26 DIAGNOSIS — E039 Hypothyroidism, unspecified: Secondary | ICD-10-CM

## 2016-12-26 DIAGNOSIS — J301 Allergic rhinitis due to pollen: Secondary | ICD-10-CM

## 2016-12-28 ENCOUNTER — Other Ambulatory Visit: Payer: Self-pay

## 2016-12-28 DIAGNOSIS — J4541 Moderate persistent asthma with (acute) exacerbation: Secondary | ICD-10-CM

## 2016-12-28 MED ORDER — MONTELUKAST SODIUM 10 MG PO TABS: 10.0000 mg | ORAL_TABLET | Freq: Every day | ORAL | 0 refills | Status: DC

## 2017-03-10 ENCOUNTER — Other Ambulatory Visit: Payer: Self-pay | Admitting: Internal Medicine

## 2017-03-11 ENCOUNTER — Other Ambulatory Visit: Payer: Self-pay | Admitting: Physician Assistant

## 2017-03-26 ENCOUNTER — Ambulatory Visit: Payer: Self-pay | Admitting: Physician Assistant

## 2017-05-25 ENCOUNTER — Other Ambulatory Visit: Payer: Self-pay | Admitting: Physician Assistant

## 2017-05-25 ENCOUNTER — Other Ambulatory Visit: Payer: Self-pay | Admitting: Internal Medicine

## 2017-05-25 ENCOUNTER — Encounter: Payer: Self-pay | Admitting: Physician Assistant

## 2017-05-25 DIAGNOSIS — J4541 Moderate persistent asthma with (acute) exacerbation: Secondary | ICD-10-CM

## 2017-05-25 DIAGNOSIS — I10 Essential (primary) hypertension: Secondary | ICD-10-CM

## 2017-06-28 ENCOUNTER — Encounter: Payer: Self-pay | Admitting: Physician Assistant

## 2017-06-28 NOTE — Progress Notes (Deleted)
Complete Physical  Assessment and Plan:  Discussed med's effects and SE's. Screening labs and tests as requested with regular follow-up as recommended. Over 40 minutes of exam, counseling, chart review, and complex, high level critical decision making was performed this visit.   HPI  42 y.o. female  presents for a complete physical and follow up for has Short cervix affecting pregnancy; H/O: cesarean section; Anxiety; Asthma; GERD (gastroesophageal reflux disease); ADD (attention deficit disorder); Hyperlipidemia; PCOS (polycystic ovarian syndrome); Other abnormal glucose; Morbid obesity ([place]); Ankle sprain; Carpal tunnel syndrome; and Vertigo on their problem list..  Her blood pressure {HAS HAS NOT:18834} been controlled at home, today their BP is   She {DOES_DOES BMW:41324} workout. She denies chest pain, shortness of breath, dizziness.   She {ACTION; IS/IS MWN:[id number]} on cholesterol medication and denies myalgias. Her cholesterol {ACTION; IS/IS NOT:21021397} at goal. The cholesterol last visit was:   Lab Results  Component Value Date   CHOL 142 11/17/2016   HDL 35 (L) 11/17/2016   LDLCALC 69 11/17/2016   TRIG 189 (H) 11/17/2016   CHOLHDL 4.1 11/17/2016    She {Has/has not:18111} been working on diet and exercise for prediabetes, she {ACTION; IS/IS NOT:21021397} on bASA, she {ACTION; IS/IS NOT:21021397} on ACE/ARB and denies {Symptoms; diabetes w/o none:19199}. Last A1C in the office was:  Lab Results  Component Value Date   HGBA1C 6.0 (H) 11/17/2016   Last GFR: Lab Results  Component Value Date   [facility] >89 11/17/2016   Patient is on Vitamin D supplement.   Lab Results  Component Value Date   VD25OH 26 (L) 06/25/2016     She is on thyroid medication. Her medication {WAS/WAS [phone]::"was not"} changed last visit.   Lab Results  Component Value Date   TSH 3.07 11/17/2016  .  BMI is There is no height or weight on file to calculate BMI., she is working on diet  and exercise. Wt Readings from Last 3 Encounters:  11/17/16 280 lb (127 kg)  06/25/16 288 lb 3.2 oz (130.7 kg)  12/02/15 297 lb (134.7 kg)    Current Medications:  Current Outpatient Medications on File Prior to Visit  Medication Sig Dispense Refill  . amphetamine-dextroamphetamine (ADDERALL) 20 MG tablet Take 1/2 to 1 tablet 2 x day if needed for ADD - Do not take daily, more effective this way, take drug holidays 60 tablet 0  . fluticasone (FLONASE) 50 MCG/ACT nasal spray USE 2 SPRAYS IN EACH NOSTRIL EVERY NIGHT AT BEDTIME 48 g 1  . ibuprofen (ADVIL,MOTRIN) 600 MG tablet Take 1 tablet (600 mg total) by mouth every 6 (six) hours. 30 tablet 0  . levocetirizine (XYZAL) 5 MG tablet TAKE 1 TABLET BY MOUTH IN THE EVENING 30 tablet 0  . levothyroxine (SYNTHROID, LEVOTHROID) 50 MCG tablet TAKE ONE TABLET BY MOUTH ONCE DAILY 90 tablet 1  . losartan-hydrochlorothiazide (HYZAAR) 100-25 MG tablet TAKE 1 TABLET BY MOUTH EVERY DAY 90 tablet 0  . meclizine (ANTIVERT) 25 MG tablet 1/2-1 pill up to 3 times daily for motion sickness/dizziness 30 tablet 0  . montelukast (SINGULAIR) 10 MG tablet TAKE 1 TABLET BY MOUTH EVERY DAY 90 tablet 0  . nystatin (MYCOSTATIN/NYSTOP) powder Apply daily after a shower as needed 60 g 2  . omeprazole (PRILOSEC) 20 MG capsule TAKE 1 CAPSULE BY MOUTH EVERY DAY 30 capsule 0  . ondansetron (ZOFRAN) 4 MG tablet Take 1 tablet (4 mg total) by mouth every 8 (eight) hours as needed for nausea or vomiting. 30 tablet  1  . sertraline (ZOLOFT) 100 MG tablet TAKE 1 TABLET BY MOUTH ONCE DAILY 90 tablet 1  . Topiramate (TOPAMAX PO) Take 50 mg by mouth.    . VENTOLIN HFA 108 (90 Base) MCG/ACT inhaler INHALE 1 PUFF INTO LUNGS EVERY 6 HOURS AS NEEDED FOR WEEZING OR SHORTNESS OF BREATH 18 g 2   No current facility-administered medications on file prior to visit.    Allergies:  Allergies  Allergen Reactions  . Lisinopril Shortness Of Breath and Cough    Breathing problems   . Bisoprolol      Chest tightness   . Latex Rash  . Mobic [Meloxicam] Itching   Medical History:  She has Short cervix affecting pregnancy; H/O: cesarean section; Anxiety; Asthma; GERD (gastroesophageal reflux disease); ADD (attention deficit disorder); Hyperlipidemia; PCOS (polycystic ovarian syndrome); Other abnormal glucose; Morbid obesity ([place]); Ankle sprain; Carpal tunnel syndrome; and Vertigo on their problem list. Health Maintenance:    Immunization History  Administered Date(s) Administered  . HPV Quadrivalent 02/04/2011  . Td 04/10/2008  . Tdap 03/15/2014    Tetanus: 2015 Pneumovax: Prevnar 13:  Flu vaccine: Zostavax:  LMP: Pap: MGM:  DEXA: Colonoscopy: N/A EGD:  Last Dental Exam: Last Eye Exam: Patient Care Team: Unk Pinto, MD as PCP - General (Internal Medicine) Janyth Contes, MD as Consulting Physician (Obstetrics and Gynecology) Drake Leach, [place] (Optometry)  Surgical History:  She has a past surgical history that includes Cervical cerclage (N/A, 04/21/2013); Cesarean section; Tonsillectomy; Bartholin cyst marsupialization; Therapeutic abortion; Wisdom tooth extraction; LEEP; Cesarean section (N/A, 08/31/2013); and Tubal ligation. Family History:  Herfamily history includes Cancer in her maternal aunt, mother, and paternal uncle; Diabetes in her maternal grandfather and paternal grandmother; Heart disease in her maternal grandfather and paternal grandfather; Hypertension in her maternal grandfather, maternal grandmother, mother, and paternal grandfather; Stroke in her maternal grandmother. Social History:  She reports that she has been smoking cigarettes.  She has a 12.50 pack-year smoking history. she has never used smokeless tobacco. She reports that she does not drink alcohol or use drugs.  Review of Systems: ROS  Physical Exam: Estimated body mass index is 41.35 kg/m as calculated from the following:   Height as of 11/17/16: 5\' 9"  (1.753 m).   Weight  as of 11/17/16: 280 lb (127 kg). There were no vitals taken for this visit. General Appearance: Well nourished, in no apparent distress.  Eyes: PERRLA, EOMs, conjunctiva no swelling or erythema, normal fundi and vessels.  Sinuses: No Frontal/maxillary tenderness  ENT/Mouth: Ext aud canals clear, normal light reflex with TMs without erythema, bulging. Good dentition. No erythema, swelling, or exudate on post pharynx. Tonsils not swollen or erythematous. Hearing normal.  Neck: Supple, thyroid normal. No bruits  Respiratory: Respiratory effort normal, BS equal bilaterally without rales, rhonchi, wheezing or stridor.  Cardio: RRR without murmurs, rubs or gallops. Brisk peripheral pulses without edema.  Chest: symmetric, with normal excursions and percussion.  Breasts: Symmetric, without lumps, nipple discharge, retractions.  Abdomen: Soft, nontender, no guarding, rebound, hernias, masses, or organomegaly.  Lymphatics: Non tender without lymphadenopathy.  Genitourinary:  Musculoskeletal: Full ROM all peripheral extremities,5/5 strength, and normal gait.  Skin: Warm, dry without rashes, lesions, ecchymosis. Neuro: Cranial nerves intact, reflexes equal bilaterally. Normal muscle tone, no cerebellar symptoms. Sensation intact.  Psych: Awake and oriented X 3, normal affect, Insight and Judgment appropriate.   EKG: WNL no ST changes. AORTA SCAN: WNL   Vicie Mutters 7:43 AM [facility]

## 2017-06-29 ENCOUNTER — Encounter: Payer: Self-pay | Admitting: Physician Assistant

## 2017-07-20 DIAGNOSIS — E039 Hypothyroidism, unspecified: Secondary | ICD-10-CM | POA: Insufficient documentation

## 2017-07-20 DIAGNOSIS — I10 Essential (primary) hypertension: Secondary | ICD-10-CM | POA: Insufficient documentation

## 2017-09-10 ENCOUNTER — Other Ambulatory Visit: Payer: Self-pay | Admitting: Adult Health

## 2017-09-10 DIAGNOSIS — I10 Essential (primary) hypertension: Secondary | ICD-10-CM

## 2017-11-02 ENCOUNTER — Other Ambulatory Visit: Payer: Self-pay | Admitting: Physician Assistant

## 2018-02-16 ENCOUNTER — Other Ambulatory Visit: Payer: Self-pay | Admitting: Adult Health

## 2018-02-16 DIAGNOSIS — I10 Essential (primary) hypertension: Secondary | ICD-10-CM

## 2018-02-23 DIAGNOSIS — G4700 Insomnia, unspecified: Secondary | ICD-10-CM | POA: Insufficient documentation

## 2018-02-23 DIAGNOSIS — Z8632 Personal history of gestational diabetes: Secondary | ICD-10-CM | POA: Insufficient documentation

## 2018-07-04 ENCOUNTER — Encounter: Payer: Self-pay | Admitting: Physician Assistant

## 2019-09-27 DIAGNOSIS — R0683 Snoring: Secondary | ICD-10-CM | POA: Insufficient documentation

## 2019-09-27 DIAGNOSIS — Z9189 Other specified personal risk factors, not elsewhere classified: Secondary | ICD-10-CM | POA: Insufficient documentation

## 2021-01-09 DIAGNOSIS — Z1231 Encounter for screening mammogram for malignant neoplasm of breast: Secondary | ICD-10-CM | POA: Insufficient documentation

## 2021-02-11 LAB — HM PAP SMEAR: HM Pap smear: NORMAL

## 2021-02-19 DIAGNOSIS — N921 Excessive and frequent menstruation with irregular cycle: Secondary | ICD-10-CM | POA: Insufficient documentation

## 2021-09-15 DIAGNOSIS — D649 Anemia, unspecified: Secondary | ICD-10-CM | POA: Insufficient documentation

## 2021-09-15 DIAGNOSIS — D500 Iron deficiency anemia secondary to blood loss (chronic): Secondary | ICD-10-CM | POA: Insufficient documentation

## 2021-11-07 DIAGNOSIS — E119 Type 2 diabetes mellitus without complications: Secondary | ICD-10-CM | POA: Insufficient documentation

## 2021-11-07 DIAGNOSIS — H3581 Retinal edema: Secondary | ICD-10-CM | POA: Insufficient documentation

## 2021-11-07 DIAGNOSIS — H43393 Other vitreous opacities, bilateral: Secondary | ICD-10-CM | POA: Insufficient documentation

## 2022-07-27 DIAGNOSIS — J4540 Moderate persistent asthma, uncomplicated: Secondary | ICD-10-CM | POA: Insufficient documentation

## 2023-03-16 LAB — HM MAMMOGRAPHY: HM Mammogram: NORMAL (ref 0–4)

## 2023-04-01 DIAGNOSIS — E1165 Type 2 diabetes mellitus with hyperglycemia: Secondary | ICD-10-CM | POA: Insufficient documentation

## 2023-11-10 ENCOUNTER — Ambulatory Visit (HOSPITAL_BASED_OUTPATIENT_CLINIC_OR_DEPARTMENT_OTHER): Admitting: Family Medicine

## 2024-03-15 ENCOUNTER — Encounter: Payer: Self-pay | Admitting: Family Medicine

## 2024-03-15 ENCOUNTER — Ambulatory Visit: Admitting: Family Medicine

## 2024-03-15 VITALS — BP 160/88 | HR 92 | Temp 98.5°F | Ht 69.0 in | Wt 306.2 lb

## 2024-03-15 DIAGNOSIS — F419 Anxiety disorder, unspecified: Secondary | ICD-10-CM

## 2024-03-15 DIAGNOSIS — J301 Allergic rhinitis due to pollen: Secondary | ICD-10-CM | POA: Diagnosis not present

## 2024-03-15 DIAGNOSIS — O99810 Abnormal glucose complicating pregnancy: Secondary | ICD-10-CM | POA: Insufficient documentation

## 2024-03-15 DIAGNOSIS — Z7985 Long-term (current) use of injectable non-insulin antidiabetic drugs: Secondary | ICD-10-CM

## 2024-03-15 DIAGNOSIS — E1165 Type 2 diabetes mellitus with hyperglycemia: Secondary | ICD-10-CM | POA: Diagnosis not present

## 2024-03-15 DIAGNOSIS — K649 Unspecified hemorrhoids: Secondary | ICD-10-CM | POA: Insufficient documentation

## 2024-03-15 DIAGNOSIS — J4540 Moderate persistent asthma, uncomplicated: Secondary | ICD-10-CM

## 2024-03-15 DIAGNOSIS — G8929 Other chronic pain: Secondary | ICD-10-CM

## 2024-03-15 DIAGNOSIS — Z720 Tobacco use: Secondary | ICD-10-CM

## 2024-03-15 DIAGNOSIS — Z1211 Encounter for screening for malignant neoplasm of colon: Secondary | ICD-10-CM

## 2024-03-15 DIAGNOSIS — D259 Leiomyoma of uterus, unspecified: Secondary | ICD-10-CM | POA: Insufficient documentation

## 2024-03-15 DIAGNOSIS — K219 Gastro-esophageal reflux disease without esophagitis: Secondary | ICD-10-CM

## 2024-03-15 DIAGNOSIS — Z7689 Persons encountering health services in other specified circumstances: Secondary | ICD-10-CM

## 2024-03-15 DIAGNOSIS — F53 Postpartum depression: Secondary | ICD-10-CM | POA: Insufficient documentation

## 2024-03-15 DIAGNOSIS — R319 Hematuria, unspecified: Secondary | ICD-10-CM | POA: Insufficient documentation

## 2024-03-15 DIAGNOSIS — I10 Essential (primary) hypertension: Secondary | ICD-10-CM

## 2024-03-15 DIAGNOSIS — O24419 Gestational diabetes mellitus in pregnancy, unspecified control: Secondary | ICD-10-CM | POA: Insufficient documentation

## 2024-03-15 DIAGNOSIS — E039 Hypothyroidism, unspecified: Secondary | ICD-10-CM | POA: Diagnosis not present

## 2024-03-15 DIAGNOSIS — E785 Hyperlipidemia, unspecified: Secondary | ICD-10-CM

## 2024-03-15 DIAGNOSIS — R5383 Other fatigue: Secondary | ICD-10-CM | POA: Insufficient documentation

## 2024-03-15 DIAGNOSIS — O3660X0 Maternal care for excessive fetal growth, unspecified trimester, not applicable or unspecified: Secondary | ICD-10-CM | POA: Insufficient documentation

## 2024-03-15 DIAGNOSIS — Z1231 Encounter for screening mammogram for malignant neoplasm of breast: Secondary | ICD-10-CM

## 2024-03-15 DIAGNOSIS — N762 Acute vulvitis: Secondary | ICD-10-CM | POA: Insufficient documentation

## 2024-03-15 MED ORDER — FLUTICASONE PROPIONATE 50 MCG/ACT NA SUSP: 2.0000 | Freq: Every day | NASAL | 1 refills | Status: AC

## 2024-03-15 MED ORDER — SERTRALINE HCL 100 MG PO TABS: 100.0000 mg | ORAL_TABLET | Freq: Two times a day (BID) | ORAL | 1 refills | Status: DC

## 2024-03-15 MED ORDER — OMEPRAZOLE 20 MG PO CPDR: 20.0000 mg | DELAYED_RELEASE_CAPSULE | Freq: Every day | ORAL | 1 refills | Status: AC

## 2024-03-15 MED ORDER — LEVOTHYROXINE SODIUM 50 MCG PO TABS: 50.0000 ug | ORAL_TABLET | Freq: Every day | ORAL | 1 refills | Status: AC

## 2024-03-15 MED ORDER — GABAPENTIN 300 MG PO CAPS: 300.0000 mg | ORAL_CAPSULE | Freq: Two times a day (BID) | ORAL | 1 refills | Status: AC

## 2024-03-15 MED ORDER — LORAZEPAM 0.5 MG PO TABS: 0.5000 mg | ORAL_TABLET | ORAL | 1 refills | Status: AC | PRN

## 2024-03-15 MED ORDER — BUPROPION HCL ER (XL) 300 MG PO TB24: 300.0000 mg | ORAL_TABLET | Freq: Every day | ORAL | 1 refills | Status: DC

## 2024-03-15 MED ORDER — AMLODIPINE BESYLATE 10 MG PO TABS: 10.0000 mg | ORAL_TABLET | Freq: Every day | ORAL | 1 refills | Status: AC

## 2024-03-15 MED ORDER — CLOBETASOL PROPIONATE 0.05 % EX OINT
1.0000 | TOPICAL_OINTMENT | Freq: Two times a day (BID) | CUTANEOUS | 1 refills | Status: AC
Start: 2024-03-15 — End: ?

## 2024-03-15 MED ORDER — ONDANSETRON HCL 4 MG PO TABS: 4.0000 mg | ORAL_TABLET | Freq: Three times a day (TID) | ORAL | 1 refills | Status: AC | PRN

## 2024-03-15 MED ORDER — LEVOCETIRIZINE DIHYDROCHLORIDE 5 MG PO TABS: 5.0000 mg | ORAL_TABLET | Freq: Every evening | ORAL | 1 refills | Status: AC

## 2024-03-15 MED ORDER — ALBUTEROL SULFATE HFA 108 (90 BASE) MCG/ACT IN AERS: 1.0000 | INHALATION_SPRAY | Freq: Four times a day (QID) | RESPIRATORY_TRACT | 2 refills | Status: AC | PRN

## 2024-03-15 MED ORDER — ADVAIR DISKUS 100-50 MCG/ACT IN AEPB: 1.0000 | INHALATION_SPRAY | Freq: Two times a day (BID) | RESPIRATORY_TRACT | 1 refills | Status: AC

## 2024-03-15 MED ORDER — TIZANIDINE HCL 2 MG PO TABS: 2.0000 mg | ORAL_TABLET | Freq: Four times a day (QID) | ORAL | 1 refills | Status: AC | PRN

## 2024-03-15 MED ORDER — VALSARTAN 320 MG PO TABS
320.0000 mg | ORAL_TABLET | Freq: Every day | ORAL | 3 refills | Status: AC
Start: 2024-03-15 — End: ?

## 2024-03-15 MED ORDER — TRULICITY 0.75 MG/0.5ML ~~LOC~~ SOAJ: 0.7500 mg | SUBCUTANEOUS | 1 refills | Status: DC

## 2024-03-15 MED ORDER — TRIAMCINOLONE ACETONIDE 0.1 % EX CREA: 1.0000 | TOPICAL_CREAM | Freq: Three times a day (TID) | CUTANEOUS | 1 refills | Status: AC

## 2024-03-15 MED ORDER — MECLIZINE HCL 25 MG PO TABS
ORAL_TABLET | ORAL | 0 refills | Status: AC
Start: 2024-03-15 — End: ?

## 2024-03-15 NOTE — Progress Notes (Signed)
 Patient Office Visit  Assessment & Plan:  Encounter to establish care  Allergic rhinitis due to pollen, unspecified seasonality -     Fluticasone  Propionate; Place 2 sprays into both nostrils daily.  Dispense: 48 g; Refill: 1 -     Levocetirizine Dihydrochloride ; Take 1 tablet (5 mg total) by mouth every evening.  Dispense: 90 tablet; Refill: 1  Type 2 diabetes mellitus with hyperglycemia, without long-term current use of insulin  (HCC) -     Hemoglobin A1c -     Trulicity; Inject 0.75 mg into the skin once a week.  Dispense: 2 mL; Refill: 1  Hyperlipidemia, unspecified hyperlipidemia type -     Lipid panel  Hypothyroidism, unspecified type -     Levothyroxine  Sodium; Take 1 tablet (50 mcg total) by mouth daily.  Dispense: 90 tablet; Refill: 1 -     TSH  Essential hypertension -     amLODIPine Besylate; Take 1 tablet (10 mg total) by mouth daily.  Dispense: 90 tablet; Refill: 1 -     Valsartan; Take 1 tablet (320 mg total) by mouth daily.  Dispense: 90 tablet; Refill: 3 -     CBC with Differential/Platelet -     Comprehensive metabolic panel with GFR  Anxiety -     buPROPion HCl ER (XL); Take 1 tablet (300 mg total) by mouth daily.  Dispense: 90 tablet; Refill: 1 -     LORazepam; Take 1 tablet (0.5 mg total) by mouth as needed for anxiety.  Dispense: 30 tablet; Refill: 1 -     Sertraline  HCl; Take 1 tablet (100 mg total) by mouth 2 (two) times daily.  Dispense: 180 tablet; Refill: 1  Screening mammogram for breast cancer -     3D Screening Mammogram, Left and Right; Future  Tobacco abuse -     CT CHEST LUNG CANCER SCREENING LOW DOSE WO CONTRAST; Future  Screening for colorectal cancer -     Ambulatory referral to Gastroenterology  Moderate persistent asthma without complication -     Advair Diskus; Inhale 1 puff into the lungs 2 (two) times daily.  Dispense: 180 each; Refill: 1 -     Albuterol  Sulfate HFA; Inhale 1 puff into the lungs every 6 (six) hours as needed for  wheezing or shortness of breath.  Dispense: 18 g; Refill: 2  Other chronic pain -     Gabapentin; Take 1 capsule (300 mg total) by mouth 2 (two) times daily.  Dispense: 180 capsule; Refill: 1 -     tiZANidine HCl; Take 1 tablet (2 mg total) by mouth every 6 (six) hours as needed for muscle spasms.  Dispense: 30 tablet; Refill: 1  Gastroesophageal reflux disease without esophagitis -     Omeprazole ; Take 1 capsule (20 mg total) by mouth daily.  Dispense: 90 capsule; Refill: 1  Other orders -     Clobetasol Propionate; Apply 1 Application topically 2 (two) times daily.  Dispense: 30 g; Refill: 1 -     Meclizine  HCl; 1/2-1 pill up to 3 times daily for motion sickness/dizziness  Dispense: 30 tablet; Refill: 0 -     Ondansetron  HCl; Take 1 tablet (4 mg total) by mouth every 8 (eight) hours as needed for nausea or vomiting.  Dispense: 30 tablet; Refill: 1 -     Triamcinolone Acetonide; Apply 1 Application topically 3 (three) times daily.  Dispense: 30 g; Refill: 1   Assessment and Plan    Essential hypertension Blood pressure elevated. Current regimen  includes losartan , hydrochlorothiazide , and amlodipine. Cardura not used for hypertension. - Continue losartan , hydrochlorothiazide , and amlodipine.  Type 2 diabetes mellitus Diabetes management suboptimal due to lack of access to Mounjaro and Trulicity. No diabetic medication for two months. - Attempt to obtain Medicaid approval for Trulicity.  Anxiety disorder Anxiety significantly elevated, likely exacerbated by financial stress and recent disability approval without immediate payment.  Depression Managed with medication, taken regularly. - Continue current depression medication regimen.     Test results were reviewed and analyzed as part of the medical decision making of this visit.  Reviewed previous notes and laboratory during the office visit. Follow-up on lab work and notify patient.  Recommend tobacco cessation.  CT scan for lung  cancer screening ordered.  Patient needs to do a mammogram and colonoscopy.  Patient declines flu shot and pneumonia vaccines.  Return in about 4 weeks (around 04/12/2024), or if symptoms worsen or fail to improve, for hypertension, type 2 diabetes, depression.   Subjective:    Patient ID: Kathleen Weaver, female    DOB: 1976-01-06  Age: 48 y.o. MRN: [id number]  Chief Complaint  Patient presents with   Medical Management of Chronic Issues   Establish Care    HPI Discussed the use of AI scribe software for clinical note transcription with the patient, who gave verbal consent to proceed.  History of Present Illness        Kathleen Weaver is a 48 year old female with hypertension and diabetes who presents with medication management issues. Also to establish primary care in our office  She has been unable to manage her type 2 diabetes effectively due to insurance issues, resulting in her not taking Mounjaro for the past two months as it is not covered by Medicaid. She was previously on Ozempic and attempts to get Trulicity covered have also been unsuccessful. Patient would like to try Trulicity if insurance covers this.   She is currently taking her blood pressure medications, including amlodipine, losartan , and hydrochlorothiazide , but reports elevated blood pressure today. She recalls being prescribed Cardura. Patient not sure who Rx Cardura.  Patient could not tolerate the diuretic and believes she was switched from losartan  to Diovan because of this.  Patient ran out of her blood pressure medications recently.  She is experiencing significant anxiety, which she attributes to financial stress and the delay in receiving disability payments. She has been approved for disability but has not received any payments yet, with the first expected later this month. This financial strain has impacted her ability to afford medications.  Patient is hoping to move to a new home/apartment in January.  She  thinks this will help her anxiety. Hypothyroidism-patient ran out of medications 2 months ago.  Patient has gained weight as a result of this and is losing her hair. Assessment and Plan Essential hypertension Blood pressure elevated. Current regimen includes losartan , hydrochlorothiazide , and amlodipine. Cardura not used for hypertension. - Continue losartan , hydrochlorothiazide , and amlodipine.  Type 2 diabetes mellitus Diabetes management suboptimal due to lack of access to Mounjaro and Trulicity. No diabetic medication for two months. - Attempt to obtain Medicaid approval for Trulicity.  Anxiety disorder Anxiety significantly elevated, likely exacerbated by financial stress and recent disability approval without immediate payment.  Patient states that Zoloft  and the Wellbutrin as well as the Ativan are helping her.  Patient has had fears of driving because her anxiety is so high at this time.  Depression Managed with medication, taken regularly. - Continue  current depression medication regimen. Chronic pain-patient is still taking gabapentin for knee pain and back pain.  Patient did run out of the medication. GERD- gastroesophageal reflux disease (GERD), she has not experienced any side effects from this medication.reports no chronic sore throat, voice changes, hemoptysis, or hematochezia. Patient is still smoking. Patient also thinks she may have a hiatal hernia Patient is currently not on cholesterol medication. The 10-year ASCVD risk score (Arnett DK, et al., 2019) is: 19.6% The 10-year ASCVD risk score (Arnett DK, et al., 2019) is: 15.8%   Values used to calculate the score:     Age: 84 years     Clincally relevant sex: Female     Is Non-Hispanic African American: No     Diabetic: Yes     Tobacco smoker: Yes     Systolic Blood Pressure: 160 mmHg     Is BP treated: Yes     HDL Cholesterol: 57 mg/dL     Total Cholesterol: 231 mg/dL  Past Medical History:  Diagnosis Date    Abnormal Pap smear    bx x2; LEEP   ADD (attention deficit disorder)    Allergy    Anemia    History of/ finally resolved with treatment   Anxiety    no meds   Arthritis    Asthma    Cervical cerclage suture present 04/21/2013   Depression 8th grade   Fibroid    GERD (gastroesophageal reflux disease)    Gestational diabetes mellitus, antepartum    GDM   H/O: cesarean section 08/31/2013   Hyperlipidemia    Hypertension    no meds    IBS (irritable bowel syndrome)    Mental disorder    mild bipolar   PCOS (polycystic ovarian syndrome)    Short cervix affecting pregnancy 04/20/2013   Thyroid  disease    Past Surgical History:  Procedure Laterality Date   BARTHOLIN CYST MARSUPIALIZATION     CERVICAL CERCLAGE N/A 04/21/2013   Procedure: CERCLAGE CERVICAL;  Surgeon: Ezzie Marshall, MD;  Location: [facility];  Service: Gynecology;  Laterality: N/A;   CESAREAN SECTION     x 2   CESAREAN SECTION N/A 08/31/2013   Procedure: CESAREAN SECTION;  Surgeon: Debby JULIANNA Lares, MD;  Location: [facility];  Service: Obstetrics;  Laterality: N/A;   HYSTEROSCOPY W/ ENDOMETRIAL ABLATION  03/07/2021   HYSTEROSCOPY WITH ENDOMETRIAL ABLATION; Surgeon: Vicenta Francis Ligas, MD; Location: [facility] MAIN OR; Service: Gynecology; Laterality: N/A; [employer]   LEEP     THERAPEUTIC ABORTION     TONSILLECTOMY     TUBAL LIGATION     WISDOM TOOTH EXTRACTION     Social History   Tobacco Use   Smoking status: Every Day    Current packs/day: 0.50    Average packs/day: 0.5 packs/day for 25.0 years (12.5 ttl pk-yrs)    Types: Cigarettes   Smokeless tobacco: Never  Substance Use Topics   Alcohol use: Yes    Comment: Two to three times a year   Drug use: No   Family History  Problem Relation Age of Onset   Hypertension Maternal Grandmother    Stroke Maternal Grandmother    Diabetes Maternal Grandfather    Heart disease Maternal Grandfather    Hypertension Maternal Grandfather    Diabetes Paternal Grandmother     Obesity Paternal Grandmother    Heart disease Paternal Grandfather    Hypertension Paternal Grandfather    Alcohol abuse Paternal Grandfather    Cancer Mother  2 cousins- w/breast   Hypertension Mother    Anxiety disorder Mother    COPD Mother    Depression Mother    Cancer Paternal Uncle    Cancer Maternal Aunt        lung/ breast   Arthritis Maternal Aunt    ADD / ADHD Daughter    Anxiety disorder Daughter    Alcohol abuse Maternal Uncle    Drug abuse Maternal Uncle    Alcohol abuse Maternal Uncle    Alcohol abuse Maternal Aunt    Anxiety disorder Sister    Depression Sister    Anxiety disorder Daughter    Arthritis Maternal Aunt    Cancer Maternal Aunt    Cancer Paternal Uncle    ADD / ADHD Son    Anxiety disorder Son    Hearing loss Neg Hx    Allergies  Allergen Reactions   Lisinopril Shortness Of Breath and Cough    Breathing problems    Topiramate Other (See Comments)    cognitive side effects, heartburn   Amitriptyline  Other (See Comments)    Lip quivering, impaired speech   Bisoprolol      Chest tightness    Latex Rash   Mobic  [Meloxicam ] Itching   Zonisamide Other (See Comments)    Drowsy/groggy    ROS    Objective:    BP (!) 160/88   Pulse 92   Temp 98.5 F (36.9 C)   Ht 5' 9 (1.753 m)   Wt (!) 306 lb 4 oz (138.9 kg)   SpO2 99%   BMI 45.23 kg/m  BP Readings from Last 3 Encounters:  03/15/24 (!) 160/88  11/17/16 128/90  06/25/16 118/76   Wt Readings from Last 3 Encounters:  03/15/24 (!) 306 lb 4 oz (138.9 kg)  11/17/16 280 lb (127 kg)  06/25/16 288 lb 3.2 oz (130.7 kg)    Physical Exam Vitals and nursing note reviewed.  Constitutional:      General: She is not in acute distress.    Appearance: Normal appearance.  HENT:     Head: Normocephalic.     Right Ear: Tympanic membrane, ear canal and external ear normal.     Left Ear: Tympanic membrane, ear canal and external ear normal.  Eyes:     Extraocular Movements:  Extraocular movements intact.     Conjunctiva/sclera: Conjunctivae normal.     Pupils: Pupils are equal, round, and reactive to light.  Cardiovascular:     Rate and Rhythm: Normal rate and regular rhythm.     Heart sounds: Normal heart sounds.  Pulmonary:     Effort: Pulmonary effort is normal.     Breath sounds: Normal breath sounds.  Abdominal:     Tenderness: There is no abdominal tenderness.  Musculoskeletal:     Right lower leg: No edema.     Left lower leg: No edema.     Comments: Has difficulty getting on exam table due to knee pain  Neurological:     General: No focal deficit present.     Mental Status: She is alert and oriented to person, place, and time.  Psychiatric:        Attention and Perception: Attention normal.        Mood and Affect: Mood is anxious.        Speech: Speech normal.        Behavior: Behavior normal.        Thought Content: Thought content normal.  Cognition and Memory: Cognition normal.        Judgment: Judgment normal.      Results for orders placed or performed in visit on 03/15/24  HM MAMMOGRAPHY  Result Value Ref Range   HM Mammogram Self Reported Normal 0-4 Bi-Rad, Self Reported Normal  HM PAP SMEAR  Result Value Ref Range   HM Pap smear Normal

## 2024-03-16 LAB — COMPREHENSIVE METABOLIC PANEL WITH GFR
AG Ratio: 1.5 (calc) (ref 1.0–2.5)
ALT: 22 U/L (ref 6–29)
AST: 26 U/L (ref 10–35)
Albumin: 4.3 g/dL (ref 3.6–5.1)
Alkaline phosphatase (APISO): 90 U/L (ref 37–153)
BUN: 12 mg/dL (ref 7–25)
CO2: 26 mmol/L (ref 20–32)
Calcium: 9.5 mg/dL (ref 8.6–10.4)
Chloride: 105 mmol/L (ref 98–110)
Creat: 0.81 mg/dL (ref 0.50–1.03)
Globulin: 2.9 g/dL (ref 1.9–3.7)
Glucose, Bld: 125 mg/dL — ABNORMAL HIGH (ref 65–99)
Potassium: 4.1 mmol/L (ref 3.5–5.3)
Sodium: 139 mmol/L (ref 135–146)
Total Bilirubin: 0.5 mg/dL (ref 0.2–1.2)
Total Protein: 7.2 g/dL (ref 6.1–8.1)
eGFR: 88 mL/min/1.73m2 (ref >=60)

## 2024-03-16 LAB — CBC WITH DIFFERENTIAL/PLATELET
Absolute Lymphocytes: 1987 {cells}/uL (ref 850–3900)
Absolute Monocytes: 508 {cells}/uL (ref 200–950)
Basophils Absolute: 31 {cells}/uL (ref 0–200)
Basophils Relative: 0.4 %
Eosinophils Absolute: 77 {cells}/uL (ref 15–500)
Eosinophils Relative: 1 %
HCT: 40.9 % (ref 35.0–45.0)
Hemoglobin: 13.9 g/dL (ref 11.7–15.5)
MCH: 29.8 pg (ref 27.0–33.0)
MCHC: 34 g/dL (ref 32.0–36.0)
MCV: 87.8 fL (ref 80.0–100.0)
MPV: 9.8 fL (ref 7.5–12.5)
Monocytes Relative: 6.6 %
Neutro Abs: 5097 {cells}/uL (ref 1500–7800)
Neutrophils Relative %: 66.2 %
Platelets: 296 Thousand/uL (ref 140–400)
RBC: 4.66 Million/uL (ref 3.80–5.10)
RDW: 13.8 % (ref 11.0–15.0)
Total Lymphocyte: 25.8 %
WBC: 7.7 Thousand/uL (ref 3.8–10.8)

## 2024-03-16 LAB — LIPID PANEL
Cholesterol: 231 mg/dL — ABNORMAL HIGH (ref <200)
HDL: 57 mg/dL (ref >=50)
LDL Cholesterol (Calc): 145 mg/dL — ABNORMAL HIGH
Non-HDL Cholesterol (Calc): 174 mg/dL — ABNORMAL HIGH (ref <130)
Total CHOL/HDL Ratio: 4.1 (calc) (ref <5.0)
Triglycerides: 159 mg/dL — ABNORMAL HIGH (ref <150)

## 2024-03-16 LAB — HEMOGLOBIN A1C
Hgb A1c MFr Bld: 6 % — ABNORMAL HIGH (ref <5.7)
Mean Plasma Glucose: 126 mg/dL
eAG (mmol/L): 7 mmol/L

## 2024-03-16 LAB — TSH: TSH: 2.72 m[IU]/L

## 2024-03-17 ENCOUNTER — Ambulatory Visit: Payer: Self-pay | Admitting: Family Medicine

## 2024-03-17 ENCOUNTER — Other Ambulatory Visit: Payer: Self-pay

## 2024-03-17 MED ORDER — ROSUVASTATIN CALCIUM 10 MG PO TABS: 10.0000 mg | ORAL_TABLET | Freq: Every day | ORAL | 3 refills | Status: AC

## 2024-04-13 ENCOUNTER — Telehealth: Payer: Self-pay | Admitting: Pharmacy Technician

## 2024-04-13 ENCOUNTER — Other Ambulatory Visit (HOSPITAL_COMMUNITY): Payer: Self-pay

## 2024-04-13 NOTE — Telephone Encounter (Signed)
 Pharmacy Patient Advocate Encounter   Received notification from Onbase that prior authorization for Trulicity  0.75MG /0.5ML auto-injectors is required/requested.   Insurance verification completed.   The patient is insured through [facility] [employer].   Per test claim: PA required; PA started via CoverMyMeds. KEY [id number] . Waiting for clinical questions to populate.

## 2024-04-14 ENCOUNTER — Other Ambulatory Visit (HOSPITAL_COMMUNITY): Payer: Self-pay

## 2024-04-14 NOTE — Telephone Encounter (Signed)
 Pharmacy Patient Advocate Encounter  Received notification from [facility] [employer] that Prior Authorization for Trulicity  0.75MG /0.5ML auto-injectors has been APPROVED from 04/13/24 to 04/13/25. Ran test claim, Copay is $25.00. This test claim was processed through [facility]- copay amounts may vary at other pharmacies due to pharmacy/plan contracts, or as the patient moves through the different stages of their insurance plan.   PA #/Case ID/Reference #: [id number]

## 2024-04-16 ENCOUNTER — Other Ambulatory Visit: Payer: Self-pay

## 2024-04-16 ENCOUNTER — Emergency Department (HOSPITAL_BASED_OUTPATIENT_CLINIC_OR_DEPARTMENT_OTHER)
Admission: EM | Admit: 2024-04-16 | Discharge: 2024-04-16 | Disposition: A | Discharge status: Home / Self Care | Payer: Self-pay | Attending: Emergency Medicine | Admitting: Emergency Medicine

## 2024-04-16 ENCOUNTER — Emergency Department (HOSPITAL_BASED_OUTPATIENT_CLINIC_OR_DEPARTMENT_OTHER): Payer: Self-pay | Admitting: Radiology

## 2024-04-16 ENCOUNTER — Encounter (HOSPITAL_BASED_OUTPATIENT_CLINIC_OR_DEPARTMENT_OTHER): Payer: Self-pay

## 2024-04-16 DIAGNOSIS — M25512 Pain in left shoulder: Secondary | ICD-10-CM

## 2024-04-16 DIAGNOSIS — G8929 Other chronic pain: Secondary | ICD-10-CM

## 2024-04-16 NOTE — ED Triage Notes (Signed)
 Pt reports L shoulder pain for several months. Pt reports possibly injury months ago but reports increased pain last PM.

## 2024-04-16 NOTE — ED Provider Notes (Signed)
 [place] EMERGENCY DEPARTMENT AT [facility] Provider Note   CSN: [id number] Arrival date & time: 04/16/24  9161     Patient presents with: Shoulder Pain   Kathleen Weaver is a 48 y.o. female.   Patient to ED with flare up of left shoulder pain x months, worse x 2 days. No specific injury. She states she has pain in the shoulder that becomes worse for periods of time, then improves but does not resolve. No swelling. No fever. She denies neck pain. No numbness or weakness.   The history is provided by the patient. No language interpreter was used.  Shoulder Pain      Prior to Admission medications   Medication Sig Start Date End Date Taking? Authorizing Provider  ADVAIR  DISKUS 100-50 MCG/ACT AEPB Inhale 1 puff into the lungs 2 (two) times daily. 03/15/24   Aletha Bene, MD  albuterol  (VENTOLIN  HFA) 108 (90 Base) MCG/ACT inhaler Inhale 1 puff into the lungs every 6 (six) hours as needed for wheezing or shortness of breath. 03/15/24   Aletha Bene, MD  amLODipine  (NORVASC ) 10 MG tablet Take 1 tablet (10 mg total) by mouth daily. 03/15/24   Aletha Bene, MD  buPROPion  (WELLBUTRIN  XL) 300 MG 24 hr tablet Take 1 tablet (300 mg total) by mouth daily. 03/15/24   Aletha Bene, MD  clobetasol  ointment (TEMOVATE ) 0.05 % Apply 1 Application topically 2 (two) times daily. 03/15/24   Aguiar, Rafaela, MD  doxazosin (CARDURA) 2 MG tablet Take 2 mg by mouth at bedtime.    [provider]  Dulaglutide  (TRULICITY ) 0.75 MG/0.5ML SOAJ Inject 0.75 mg into the skin once a week. 03/15/24   Aletha Bene, MD  fluticasone  (FLONASE ) 50 MCG/ACT nasal spray Place 2 sprays into both nostrils daily. 03/15/24   Aletha Bene, MD  gabapentin  (NEURONTIN ) 300 MG capsule Take 1 capsule (300 mg total) by mouth 2 (two) times daily. 03/15/24   Aletha Bene, MD  ibuprofen  (ADVIL ,MOTRIN ) 600 MG tablet Take 1 tablet (600 mg total) by mouth every 6 (six) hours. 09/02/13   Estelle Service, MD   levocetirizine (XYZAL ) 5 MG tablet Take 1 tablet (5 mg total) by mouth every evening. 03/15/24   Aletha Bene, MD  levothyroxine  (SYNTHROID ) 50 MCG tablet Take 1 tablet (50 mcg total) by mouth daily. 03/15/24   Aletha Bene, MD  LORazepam  (ATIVAN ) 0.5 MG tablet Take 1 tablet (0.5 mg total) by mouth as needed for anxiety. 03/15/24   Aletha Bene, MD  meclizine  (ANTIVERT ) 25 MG tablet 1/2-1 pill up to 3 times daily for motion sickness/dizziness 03/15/24   Aletha Bene, MD  omeprazole  (PRILOSEC) 20 MG capsule Take 1 capsule (20 mg total) by mouth daily. 03/15/24   Aletha Bene, MD  ondansetron  (ZOFRAN ) 4 MG tablet Take 1 tablet (4 mg total) by mouth every 8 (eight) hours as needed for nausea or vomiting. 03/15/24   Aletha Bene, MD  rosuvastatin  (CRESTOR ) 10 MG tablet Take 1 tablet (10 mg total) by mouth daily. 03/17/24   Aletha Bene, MD  sertraline  (ZOLOFT ) 100 MG tablet Take 1 tablet (100 mg total) by mouth 2 (two) times daily. 03/15/24   Aletha Bene, MD  tiZANidine  (ZANAFLEX ) 2 MG tablet Take 1 tablet (2 mg total) by mouth every 6 (six) hours as needed for muscle spasms. 03/15/24   Aletha Bene, MD  triamcinolone  cream (KENALOG ) 0.1 % Apply 1 Application topically 3 (three) times daily. 03/15/24   Aletha Bene, MD  valsartan  (DIOVAN ) 320 MG tablet Take  1 tablet (320 mg total) by mouth daily. 03/15/24   Aguiar, Rafaela, MD    Allergies: Lisinopril, Topiramate, Amitriptyline , Bisoprolol , Latex, Mobic  [meloxicam ], and Zonisamide    Review of Systems  Updated Vital Signs BP (!) 190/85 (BP Location: Right Arm)   Pulse 71   Temp 98.1 F (36.7 C) (Oral)   Resp 17   Ht 5' 8 (1.727 m)   Wt (!) 138.8 kg   SpO2 93%   BMI 46.53 kg/m   Physical Exam Vitals and nursing note reviewed.  Constitutional:      General: She is not in acute distress.    Appearance: She is well-developed. She is not ill-appearing.  Pulmonary:     Effort: Pulmonary effort is normal.   Musculoskeletal:        General: Normal range of motion.     Cervical back: Normal range of motion.     Comments: Left shoulder has no reproducible tenderness. No swelling appreciated with large body habitus. FROM without strength loss with pain on abduction against resistance. Distal pulses intact.   Skin:    General: Skin is warm and dry.  Neurological:     Mental Status: She is alert and oriented to person, place, and time.     (all labs ordered are listed, but only abnormal results are displayed) Labs Reviewed - No data to display  EKG: None  Radiology: No results found.   Procedures   Medications Ordered in the ED - No data to display  Clinical Course as of 04/16/24 1025  Sun Apr 16, 2024  1021 Chronic left shoulder pain, increased pain x 2 days. No fall. She states she uses UE's to rise from sitting secondary to debilitating chronic knee pain. Also has a large 70-pound dog she has to keep from jumping up which tends to cause shoulder pain on the left. Suspect MSK pain: bursitis vs sprain/strain. No shoulder separation on imaging. No AC joint tenderness. Will refer to ortho for further management.  [SU]    Clinical Course User Index [SU] Odell Balls, PA-C                                 Medical Decision Making Amount and/or Complexity of Data Reviewed Radiology: ordered.        Final diagnoses:  Chronic left shoulder pain  Acute pain of left shoulder    ED Discharge Orders     None          Odell Balls, PA-C 04/16/24 1030    Bernard Drivers, MD 04/17/24 1231

## 2024-04-16 NOTE — Discharge Instructions (Addendum)
 As we discussed, follow up with orthopedics for further evaluation and management of left shoulder pain. Continue Tylenol  and/or ibuprofen  for pain and inflammation.

## 2024-04-26 ENCOUNTER — Ambulatory Visit: Admitting: Family Medicine

## 2024-05-20 ENCOUNTER — Other Ambulatory Visit: Payer: Self-pay | Admitting: Family Medicine

## 2024-05-20 DIAGNOSIS — F419 Anxiety disorder, unspecified: Secondary | ICD-10-CM

## 2024-05-26 ENCOUNTER — Ambulatory Visit: Admitting: Family Medicine

## 2024-05-30 ENCOUNTER — Encounter: Payer: Self-pay | Admitting: Pediatrics

## 2024-06-02 ENCOUNTER — Encounter: Payer: Self-pay | Admitting: Family Medicine

## 2024-06-06 ENCOUNTER — Telehealth: Payer: Self-pay | Admitting: Pharmacy Technician

## 2024-06-06 ENCOUNTER — Other Ambulatory Visit (HOSPITAL_COMMUNITY): Payer: Self-pay

## 2024-06-06 ENCOUNTER — Other Ambulatory Visit: Payer: Self-pay

## 2024-06-06 MED ORDER — SEMAGLUTIDE(0.25 OR 0.5MG/DOS) 2 MG/3ML ~~LOC~~ SOPN: 0.2500 mg | PEN_INJECTOR | SUBCUTANEOUS | 1 refills | Status: AC

## 2024-06-06 NOTE — Telephone Encounter (Signed)
 Pharmacy Patient Advocate Encounter   Received notification from [facility] KEY that prior authorization for Ozempic  (0.25 or 0.5 MG/DOSE) 2MG /3ML pen-injectors is required/requested.   Insurance verification completed.   The patient is insured through [facility] [employer].   Per test claim: PA required; PA started via CoverMyMeds. KEY [id number] . Waiting for clinical questions to populate.

## 2024-06-06 NOTE — Telephone Encounter (Signed)
 Pharmacy Patient Advocate Encounter  Received notification from [facility] [employer] that Prior Authorization for Ozempic  (0.25 or 0.5 MG/DOSE) 2MG /3ML pen-injectors has been APPROVED from 06/06/24 to 06/06/25. Ran test claim, Copay is [phone] with a savings card. This test claim was processed through [facility]- copay amounts may vary at other pharmacies due to pharmacy/plan contracts, or as the patient moves through the different stages of their insurance plan.   PA #/Case ID/Reference #: [id number]  **I also signed her up for a savings card that saved her $200.00 and the card info is below, but she may want to visit [email or web address]  savings and see if she can obtain the savings for uninsured because it could be much cheaper**

## 2024-06-09 ENCOUNTER — Telehealth: Payer: Self-pay | Admitting: Acute Care

## 2024-06-09 DIAGNOSIS — Z122 Encounter for screening for malignant neoplasm of respiratory organs: Secondary | ICD-10-CM

## 2024-06-09 DIAGNOSIS — Z87891 Personal history of nicotine dependence: Secondary | ICD-10-CM

## 2024-06-09 DIAGNOSIS — F17210 Nicotine dependence, cigarettes, uncomplicated: Secondary | ICD-10-CM

## 2024-06-09 NOTE — Telephone Encounter (Signed)
 Lung Cancer Screening Narrative/Criteria Questionnaire (Cigarette Smokers Only- No Cigars/Pipes/vapes)   Kathleen Weaver   SDMV:06/14/24@1145a CHAMP                                           25-Oct-1975               LDCT: 06/21/24@11am /DWB    48 y.o.   Phone: [phone]  Lung Screening Narrative (confirm age 30-77 yrs Medicare / 50-80 yrs Private pay insurance)   Insurance information:[employer]   Referring Provider:Aguiar   This screening involves an initial phone call with a team member from our program. It is called a shared decision making visit. The initial meeting is required by insurance and Medicare to make sure you understand the program. This appointment takes about 15-20 minutes to complete. The CT scan will completed at a separate date/time. This scan takes about 5-10 minutes to complete and you may eat and drink before and after the scan.  Criteria questions for Lung Cancer Screening:   Are you a current or former smoker? Current Age began smoking: 11y   If you are a former smoker, what year did you quit smoking? NA   To calculate your smoking history, I need an accurate estimate of how many packs of cigarettes you smoked per day and for how many years. (Not just the number of PPD you are now smoking)   Years smoking 37 x Packs per day 1/2 to 1ppd recently less = Pack years 27   (at least 20 pack yrs)   (Make sure they understand that we need to know how much they have smoked in the past, not just the number of PPD they are smoking now)  Do you have a personal history of cancer?  No    Do you have a family history of cancer? Yes  (cancer type and and relative) mat aunt/lung&brain - pat uncle -unknown kind in arm  Are you coughing up blood?  No  Have you had unexplained weight loss of 15 lbs or more in the last 6 months? No  It looks like you meet all criteria.     Additional information: N/A

## 2024-06-12 ENCOUNTER — Other Ambulatory Visit (HOSPITAL_COMMUNITY): Payer: Self-pay

## 2024-06-13 ENCOUNTER — Telehealth: Payer: Self-pay

## 2024-06-13 VITALS — Ht 68.0 in | Wt 300.0 lb

## 2024-06-13 DIAGNOSIS — Z1211 Encounter for screening for malignant neoplasm of colon: Secondary | ICD-10-CM

## 2024-06-13 MED ORDER — PEG 3350-KCL-NA BICARB-NACL 420 G PO SOLR: 4000.0000 mL | Freq: Once | ORAL | 0 refills | Status: AC

## 2024-06-13 NOTE — Telephone Encounter (Signed)
 Patient no showed pre visit.  Will wait to see if patient reschedules PV.  If not, colonoscopy will be cancelled

## 2024-06-14 ENCOUNTER — Encounter: Payer: Self-pay | Admitting: Adult Health

## 2024-06-14 ENCOUNTER — Ambulatory Visit: Payer: Self-pay | Admitting: Adult Health

## 2024-06-14 DIAGNOSIS — F17210 Nicotine dependence, cigarettes, uncomplicated: Secondary | ICD-10-CM | POA: Diagnosis not present

## 2024-06-14 NOTE — Patient Instructions (Signed)

## 2024-06-14 NOTE — Progress Notes (Signed)
" °  Virtual Visit via Telephone Note  I connected with Kathleen Weaver , 06/14/24 12:09 PM by a telemedicine application and verified that I am speaking with the correct person using two identifiers.  Location: Patient: home Provider: home   I discussed the limitations of evaluation and management by telemedicine and the availability of in person appointments. The patient expressed understanding and agreed to proceed.   Shared Decision Making Visit Lung Cancer Screening Program [phone])   Eligibility: 49 y.o. Pack Years Smoking History Calculation = 76 pack years  (# packs/per year x # years smoked) Recent History of coughing up blood  no Unexplained weight loss? no ( >Than 15 pounds within the last 6 months ) Prior History Lung / other cancer no (Diagnosis within the last 5 years already requiring surveillance chest CT Scans). Smoking Status Current Smoker  Visit Components: Discussion included one or more decision making aids. YES Discussion included risk/benefits of screening. YES Discussion included potential follow up diagnostic testing for abnormal scans. YES Discussion included meaning and risk of over diagnosis. YES Discussion included meaning and risk of False Positives. YES Discussion included meaning of total radiation exposure. YES  Counseling Included: Importance of adherence to annual lung cancer LDCT screening. YES Impact of comorbidities on ability to participate in the program. YES Ability and willingness to under diagnostic treatment. YES  Smoking Cessation Counseling: Current Smokers:  Discussed importance of smoking cessation. yes Information about tobacco cessation classes and interventions provided to patient. yes Patient provided with ticket for LDCT Scan. yes Symptomatic Patient. NO Diagnosis Code: Tobacco Use Z72.0 Asymptomatic Patient yes  Counseling - 4 minutes of smoking cessation counseling  Smoking/Tobacco Cessation Counseling Kathleen Weaver is a current user of tobacco or nicotine products. She is not ready to quit at this time. Counseling provided today addressed the risks of continued use and the benefits of cessation. Discussed tobacco/nicotine use history, readiness to quit, and evidence-based treatment options including behavioral strategies, support resources, and pharmacologic therapies. Provided encouragement and educational materials on steps and resources to quit smoking. Patient questions were addressed, and follow-up recommended for continued support. Total time spent on counseling: 3 minutes.    Z12.2-Screening of respiratory organs Z87.891-Personal history of nicotine dependence   [name] 06/14/24      "

## 2024-06-15 ENCOUNTER — Encounter: Payer: Self-pay | Admitting: Pediatrics

## 2024-06-21 ENCOUNTER — Ambulatory Visit (HOSPITAL_BASED_OUTPATIENT_CLINIC_OR_DEPARTMENT_OTHER): Payer: Self-pay

## 2024-06-26 ENCOUNTER — Encounter: Payer: Self-pay | Admitting: Pediatrics
